# Patient Record
Sex: Female | Born: 1990 | Race: White | Hispanic: No | Marital: Single | Smoking: Never smoker
Health system: Southern US, Community
[De-identification: ages and names within clinical notes are randomized; demographics above are authoritative.]

## PROBLEM LIST (undated history)

## (undated) DIAGNOSIS — F419 Anxiety disorder, unspecified: Secondary | ICD-10-CM

## (undated) DIAGNOSIS — J302 Other seasonal allergic rhinitis: Secondary | ICD-10-CM

## (undated) DIAGNOSIS — J45909 Unspecified asthma, uncomplicated: Secondary | ICD-10-CM

## (undated) HISTORY — DX: Unspecified asthma, uncomplicated: J45.909

---
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
## 2012-07-25 IMAGING — CR DG THORACIC SPINE 3V
3 series · 3 of 3 positions shown · non-contrast
Comparison: None

CLINICAL DATA: Back pain.

THORACIC SPINE - 2 VIEW + SWIMMERS

[w t-spine a.p. *]
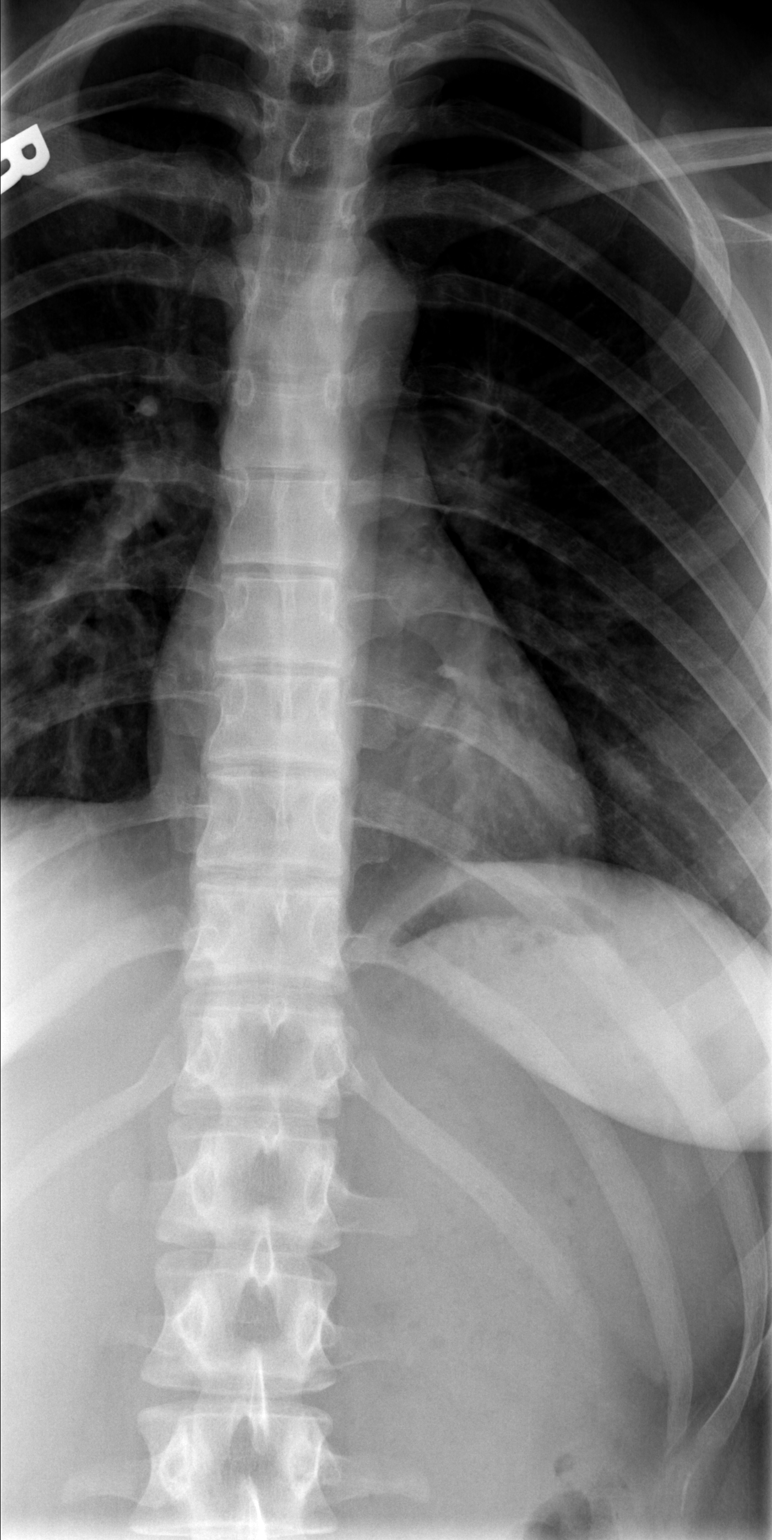

[w t-spine lat *]
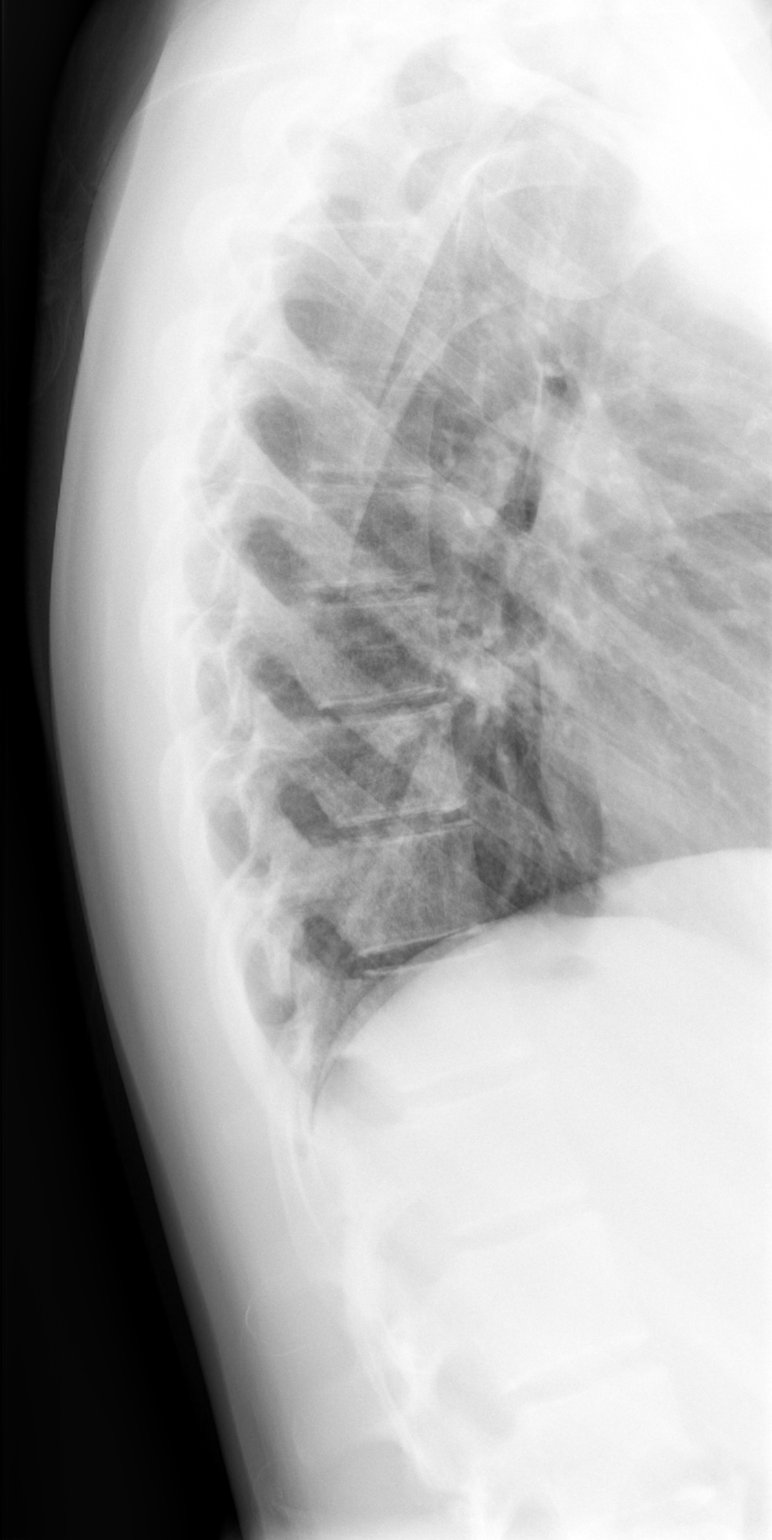

[w swimmers view]
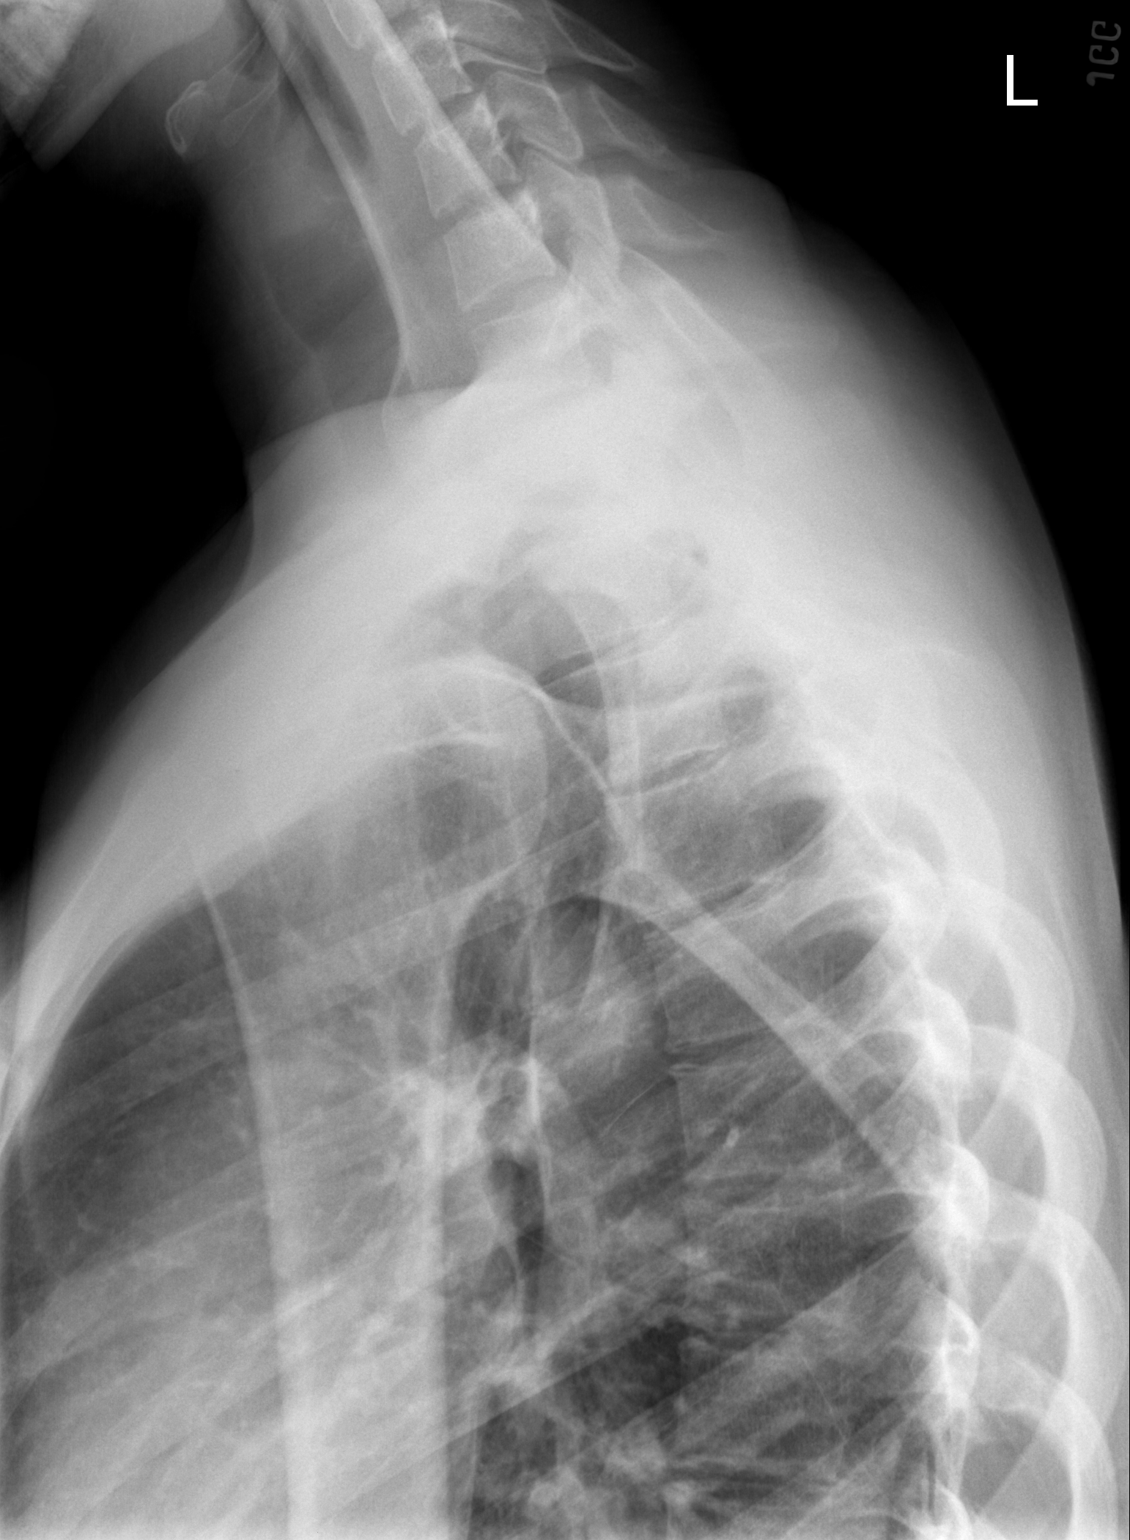

[3 of 3 positions shown; findings below may reference images not displayed]

FINDINGS: The lateral film demonstrates normal alignment of the
thoracic vertebral bodies.  Disc spaces and vertebral bodies are
maintained.  No acute bony findings, destructive bony changes or
abnormal paraspinal soft tissue swelling.  The visualized posterior
ribs appear normal.
IMPRESSION: Normal alignment and no acute bony findings.

## 2013-04-05 ENCOUNTER — Emergency Department: Payer: Self-pay | Admitting: Emergency Medicine

## 2013-04-08 LAB — BETA STREP CULTURE(ARMC)

## 2015-01-29 DIAGNOSIS — J309 Allergic rhinitis, unspecified: Secondary | ICD-10-CM | POA: Insufficient documentation

## 2015-03-26 DIAGNOSIS — F419 Anxiety disorder, unspecified: Secondary | ICD-10-CM

## 2015-03-26 DIAGNOSIS — F329 Major depressive disorder, single episode, unspecified: Secondary | ICD-10-CM | POA: Insufficient documentation

## 2015-04-23 DIAGNOSIS — N644 Mastodynia: Secondary | ICD-10-CM | POA: Insufficient documentation

## 2015-04-26 ENCOUNTER — Other Ambulatory Visit: Payer: Self-pay | Admitting: Family Medicine

## 2015-04-26 DIAGNOSIS — N6320 Unspecified lump in the left breast, unspecified quadrant: Secondary | ICD-10-CM

## 2015-04-30 ENCOUNTER — Ambulatory Visit
Admission: RE | Admit: 2015-04-30 | Discharge: 2015-04-30 | Disposition: A | Discharge status: Home / Self Care | Payer: Commercial Managed Care - PPO | Source: Ambulatory Visit | Attending: Family Medicine | Admitting: Family Medicine

## 2015-04-30 ENCOUNTER — Other Ambulatory Visit: Payer: Self-pay

## 2015-04-30 DIAGNOSIS — N6320 Unspecified lump in the left breast, unspecified quadrant: Secondary | ICD-10-CM

## 2015-06-26 ENCOUNTER — Emergency Department (HOSPITAL_BASED_OUTPATIENT_CLINIC_OR_DEPARTMENT_OTHER)
Admission: EM | Admit: 2015-06-26 | Discharge: 2015-06-26 | Disposition: A | Discharge status: Home / Self Care | Payer: Commercial Managed Care - PPO | Attending: Emergency Medicine | Admitting: Emergency Medicine

## 2015-06-26 ENCOUNTER — Encounter (HOSPITAL_BASED_OUTPATIENT_CLINIC_OR_DEPARTMENT_OTHER): Payer: Self-pay | Admitting: Emergency Medicine

## 2015-06-26 DIAGNOSIS — E869 Volume depletion, unspecified: Secondary | ICD-10-CM | POA: Insufficient documentation

## 2015-06-26 DIAGNOSIS — Z3202 Encounter for pregnancy test, result negative: Secondary | ICD-10-CM | POA: Insufficient documentation

## 2015-06-26 DIAGNOSIS — Z79899 Other long term (current) drug therapy: Secondary | ICD-10-CM | POA: Diagnosis not present

## 2015-06-26 DIAGNOSIS — Z7951 Long term (current) use of inhaled steroids: Secondary | ICD-10-CM | POA: Diagnosis not present

## 2015-06-26 DIAGNOSIS — R55 Syncope and collapse: Secondary | ICD-10-CM | POA: Diagnosis present

## 2015-06-26 DIAGNOSIS — R110 Nausea: Secondary | ICD-10-CM | POA: Insufficient documentation

## 2015-06-26 DIAGNOSIS — F419 Anxiety disorder, unspecified: Secondary | ICD-10-CM | POA: Diagnosis not present

## 2015-06-26 HISTORY — DX: Anxiety disorder, unspecified: F41.9

## 2015-06-26 HISTORY — DX: Other seasonal allergic rhinitis: J30.2

## 2015-06-26 LAB — CBC
HCT: 39.7 % (ref 36.0–46.0)
HEMOGLOBIN: 13.7 g/dL (ref 12.0–15.0)
MCH: 29.8 pg (ref 26.0–34.0)
MCHC: 34.5 g/dL (ref 30.0–36.0)
MCV: 86.3 fL (ref 78.0–100.0)
PLATELETS: 295 10*3/uL (ref 150–400)
RBC: 4.6 MIL/uL (ref 3.87–5.11)
RDW: 12.4 % (ref 11.5–15.5)
WBC: 12.4 10*3/uL — AB (ref 4.0–10.5)

## 2015-06-26 LAB — URINE MICROSCOPIC-ADD ON: WBC, UA: NONE SEEN WBC/hpf (ref 0–5)

## 2015-06-26 LAB — PREGNANCY, URINE: PREG TEST UR: NEGATIVE

## 2015-06-26 LAB — BASIC METABOLIC PANEL
ANION GAP: 6 (ref 5–15)
BUN: 8 mg/dL (ref 6–20)
CHLORIDE: 111 mmol/L (ref 101–111)
CO2: 24 mmol/L (ref 22–32)
Calcium: 8.5 mg/dL — ABNORMAL LOW (ref 8.9–10.3)
Creatinine, Ser: 0.53 mg/dL (ref 0.44–1.00)
GFR calc Af Amer: >60 mL/min (ref >60)
GLUCOSE: 93 mg/dL (ref 65–99)
POTASSIUM: 4.4 mmol/L (ref 3.5–5.1)
SODIUM: 141 mmol/L (ref 135–145)

## 2015-06-26 LAB — URINALYSIS, ROUTINE W REFLEX MICROSCOPIC
BILIRUBIN URINE: NEGATIVE
Glucose, UA: NEGATIVE mg/dL
KETONES UR: NEGATIVE mg/dL
Leukocytes, UA: NEGATIVE
NITRITE: NEGATIVE
Protein, ur: NEGATIVE mg/dL
SPECIFIC GRAVITY, URINE: 1.015 (ref 1.005–1.030)
pH: 8.5 — ABNORMAL HIGH (ref 5.0–8.0)

## 2015-06-26 MED ORDER — SODIUM CHLORIDE 0.9 % IV SOLN
1000.0000 mL | Freq: Once | INTRAVENOUS | Status: AC
  Administered 2015-06-26: 1000 mL via INTRAVENOUS

## 2015-06-26 MED ORDER — ONDANSETRON HCL 4 MG/2ML IJ SOLN
4.0000 mg | Freq: Once | INTRAMUSCULAR | Status: AC
  Administered 2015-06-26: 4 mg via INTRAVENOUS
  Filled 2015-06-26: qty 2

## 2015-06-26 MED ORDER — SODIUM CHLORIDE 0.9 % IV SOLN
1000.0000 mL | INTRAVENOUS | Status: DC
  Administered 2015-06-26: 1000 mL via INTRAVENOUS

## 2015-06-26 MED ORDER — ONDANSETRON 8 MG PO TBDP: 8.0000 mg | ORAL_TABLET | Freq: Three times a day (TID) | ORAL | Status: DC | PRN

## 2015-06-26 MED FILL — ONDANSETRON ODT 8 MG TABLET: 8 | 3 days supply | Qty: 9 | Fill #0

## 2015-06-26 NOTE — ED Provider Notes (Signed)
CSN: [id number]     Arrival date & time 06/26/15  7829 History   First MD Initiated Contact with Patient 06/26/15 [phone]     Chief Complaint  Patient presents with  . Near Syncope     HPI Patient reports that today she suddenly became nauseated and felt weak and lightheaded.  She felt as though she is going to pass out.  No preceding chest pain or palpitations.  She has not had any vomiting.  She states she felt fine yesterday went to bed feeling normal.  Today she is felt poorly.  She denies dysuria or urinary frequency.  She has no major medical illnesses.  She reports normal menstrual cycle last week with no abnormal vaginal bleeding.  She has been told that she was anemic before.  She is taking iron supplements per the patient.  No other complaints.   Past Medical History  Diagnosis Date  . Anxiety   . Seasonal allergies    No past surgical history on file. No family history on file. Social History  Substance Use Topics  . Smoking status: Never Smoker   . Smokeless tobacco: None  . Alcohol Use: None   OB History    No data available     Review of Systems  All other systems reviewed and are negative.     Allergies  Review of patient's allergies indicates no known allergies.  Home Medications   Prior to Admission medications   Medication Sig Start Date End Date Taking? Authorizing Provider  cetirizine (ZYRTEC) 5 MG tablet Take 5 mg by mouth daily.   Yes Historical Provider, MD  FLUoxetine (PROZAC) 40 MG capsule Take 40 mg by mouth daily.   Yes Historical Provider, MD  fluticasone (FLONASE) 50 MCG/ACT nasal spray Place 2 sprays into both nostrils daily.   Yes Historical Provider, MD  montelukast (SINGULAIR) 10 MG tablet Take 10 mg by mouth at bedtime.   Yes Historical Provider, MD  UNKNOWN TO PATIENT    Yes Historical Provider, MD   BP 119/74 mmHg  Pulse 72  Temp(Src) 98.4 F (36.9 C) (Oral)  Resp 22  SpO2 100%  LMP 06/18/2015 Physical Exam  Constitutional: She  is oriented to person, place, and time. She appears well-developed and well-nourished. No distress.  HENT:  Head: Normocephalic and atraumatic.  Eyes: EOM are normal.  Neck: Normal range of motion.  Cardiovascular: Normal rate, regular rhythm and normal heart sounds.   Pulmonary/Chest: Effort normal and breath sounds normal.  Abdominal: Soft. She exhibits no distension. There is no tenderness.  Musculoskeletal: Normal range of motion.  Neurological: She is alert and oriented to person, place, and time.  Skin: Skin is warm and dry. There is pallor.  Psychiatric: She has a normal mood and affect. Judgment normal.  Nursing note and vitals reviewed.   ED Course  Procedures (including critical care time) Labs Review Labs Reviewed  CBC - Abnormal; Notable for the following:    WBC 12.4 (*)    All other components within normal limits  URINALYSIS, ROUTINE W REFLEX MICROSCOPIC (NOT AT [facility]) - Abnormal; Notable for the following:    pH 8.5 (*)    Hgb urine dipstick TRACE (*)    All other components within normal limits  URINE MICROSCOPIC-ADD ON - Abnormal; Notable for the following:    Squamous Epithelial / LPF 0-5 (*)    Bacteria, UA RARE (*)    All other components within normal limits  BASIC METABOLIC PANEL - Abnormal;  Notable for the following:    Calcium 8.5 (*)    All other components within normal limits  PREGNANCY, URINE  BASIC METABOLIC PANEL    Imaging Review No results found. I have personally reviewed and evaluated these images and lab results as part of my medical decision-making.   EKG Interpretation   Date/Time:  Tuesday June 26 2015 09:28:51 EDT Ventricular Rate:  80 PR Interval:  131 QRS Duration: 120 QT Interval:  400 QTC Calculation: 461 R Axis:   77 Text Interpretation:  Sinus rhythm IVCD, consider atypical RBBB No old  tracing to compare Confirmed by Deeanne Deininger  MD, Morris Markham ([id number]) on 06/26/2015  10:53:45 AM      MDM   Final diagnoses:  Nausea   Volume depletion  Near syncope    1:21 PM Patient feels much better this time.  She can ambulate without difficulty around the ER.  She feels better after IV fluids.  Likely nausea and mild volume depletion.  Repeat abdominal exam is benign.  Labs without significant abnormality.  She understands to follow-up with her primary care physician or return to the ER for new or worsening symptoms    Azalia BilisKevin Nitesh Pitstick, MD 06/26/15 1321

## 2015-06-26 NOTE — ED Notes (Signed)
Ambulated in hallway without difficulty independently

## 2015-06-26 NOTE — ED Notes (Signed)
Pt had near syncopal event this am.  Pt was standing at the sink and became dizzy, room was spinning, vision started to close, turning black.   Pt sat down immediately and then laid down in the bed but dizziness has not improved.  No recent illness.

## 2015-06-26 NOTE — ED Notes (Signed)
Green top hemolyzed according to ChandlerSherry in the lab.  Reordered CMP

## 2015-07-17 DIAGNOSIS — R42 Dizziness and giddiness: Secondary | ICD-10-CM | POA: Insufficient documentation

## 2015-07-17 DIAGNOSIS — L304 Erythema intertrigo: Secondary | ICD-10-CM | POA: Insufficient documentation

## 2015-09-24 DIAGNOSIS — K219 Gastro-esophageal reflux disease without esophagitis: Secondary | ICD-10-CM | POA: Insufficient documentation

## 2015-10-22 ENCOUNTER — Encounter: Payer: Self-pay | Admitting: Pediatrics

## 2015-10-22 ENCOUNTER — Ambulatory Visit (INDEPENDENT_AMBULATORY_CARE_PROVIDER_SITE_OTHER): Payer: Commercial Managed Care - PPO | Admitting: Pediatrics

## 2015-10-22 VITALS — BP 104/60 | HR 76 | Temp 98.8°F | Resp 16 | Ht 66.5 in | Wt 184.6 lb

## 2015-10-22 DIAGNOSIS — T7800XA Anaphylactic reaction due to unspecified food, initial encounter: Secondary | ICD-10-CM

## 2015-10-22 DIAGNOSIS — J4530 Mild persistent asthma, uncomplicated: Secondary | ICD-10-CM | POA: Diagnosis not present

## 2015-10-22 DIAGNOSIS — J301 Allergic rhinitis due to pollen: Secondary | ICD-10-CM

## 2015-10-22 MED ORDER — EPINEPHRINE 0.3 MG/0.3ML IJ SOAJ: 0.3000 mg | Freq: Once | INTRAMUSCULAR | Status: AC

## 2015-10-22 MED ORDER — ALBUTEROL SULFATE HFA 108 (90 BASE) MCG/ACT IN AERS: 2.0000 | INHALATION_SPRAY | RESPIRATORY_TRACT | Status: AC | PRN

## 2015-10-22 NOTE — Progress Notes (Signed)
[address] [place] [address] Dept: [phone]  New Patient Note  Patient ID: Hannah Mcmahon, female    DOB: 10-Oct-1990  Age: 25 y.o. MRN: [id number] Date of Office Visit: 10/22/2015 Referring provider: Macy Mis, MD [address] [place], [place] [address]    Chief Complaint: Allergy Testing and Asthma  HPI Rabecka Brendel presents for evaluation of a runny nose and sneezing and stuffiness for about 10 years. Her symptoms are worse in the springtime. She has had some swelling of her hands and itching since 25 years of age. She has had throat swelling from foods containing citric acid. She has nasal congestion on exposure to dust, cigarette smoke and weather changes. She has a history of asthma since 25 years of age and her asthma is much improved. She now has some shortness of breath with exercise. She has gastroesophageal reflux  Review of Systems  Constitutional: Negative.   HENT:       Nasal congestion for several years which is worse in the springtime  Eyes:       Occasionally itch  Respiratory:       Asthma since 25 years of age but improved  Cardiovascular: Negative.   Gastrointestinal:       Gastroesophageal reflux  Genitourinary: Negative.   Musculoskeletal: Negative.   Skin:       Swelling of her hands and itching of her hands since 25 years of age. No history of eczema  Neurological: Negative.   Endo/Heme/Allergies:       No diabetes or thyroid disease. Throat swelling from citrus-containing foods or drinks  Psychiatric/Behavioral: Negative.     Outpatient Encounter Prescriptions as of 10/22/2015  Medication Sig  . cetirizine (ZYRTEC) 5 MG tablet Take 5 mg by mouth daily.  . clonazePAM (KLONOPIN) 0.5 MG tablet Take 0.5 mg by mouth as needed for anxiety.  Marland Kitchen FLUoxetine (PROZAC) 40 MG capsule Take 40 mg by mouth daily.  . fluticasone (FLONASE) 50 MCG/ACT nasal spray Place 2 sprays into both nostrils daily.  . montelukast (SINGULAIR) 10 MG  tablet Take 10 mg by mouth at bedtime.  . Multiple Vitamins-Minerals (MULTIVITAMIN WOMEN PO) Take 1 tablet by mouth daily.  . norgestrel-ethinyl estradiol (LO/OVRAL,CRYSELLE) 0.3-30 MG-MCG tablet Take 1 tablet by mouth daily.  Marland Kitchen omeprazole (PRILOSEC) 20 MG capsule Take 20 mg by mouth daily.  Marland Kitchen albuterol (VENTOLIN HFA) 108 (90 Base) MCG/ACT inhaler Inhale 2 puffs into the lungs every 4 (four) hours as needed for wheezing or shortness of breath.  . EPINEPHrine 0.3 mg/0.3 mL IJ SOAJ injection Inject 0.3 mLs (0.3 mg total) into the muscle once.  . [DISCONTINUED] ondansetron (ZOFRAN ODT) 8 MG disintegrating tablet Take 1 tablet (8 mg total) by mouth every 8 (eight) hours as needed for nausea or vomiting.  . [DISCONTINUED] UNKNOWN TO PATIENT    No facility-administered encounter medications on file as of 10/22/2015.     Drug Allergies:  No Known Allergies  Family History: Aarini's family history includes Allergic rhinitis in her mother..Asthma in her maternal grandmother, sinus problems. There is no family history of eczema hives, food allergies, chronic bronchitis or emphysema.  Social and environmental. There two cats in the home. She has never smoked cigarettes. She is not exposed to cigarette smoking. 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  Physical Exam: BP 104/60 mmHg  Pulse 76  Temp(Src) 98.8 F (37.1 C) (Oral)  Resp 16  Ht 5' 6.5" (1.689 m)  Wt 184 lb 9.6  oz (83.734 kg)  BMI 29.35 kg/m2   Physical Exam  Constitutional: She is oriented to person, place, and time. She appears well-developed and well-nourished.  HENT:  Eyes normal. Ears normal. Nose moderate swelling of nasal turbinates with clear nasal discharge. Pharynx normal.  Neck: Neck supple. No thyromegaly present.  Cardiovascular:  S1 and S2 normal no murmurs  Pulmonary/Chest:  Clear to percussion auscultation  Abdominal: Soft. There is no tenderness (no hepatosplenomegaly).  Lymphadenopathy:    She has no cervical  adenopathy.  Neurological: She is alert and oriented to person, place, and time.  Skin:  Clear  Psychiatric: She has a normal mood and affect. Her behavior is normal. Judgment and thought content normal.  Vitals reviewed.   Diagnostics: FVC 4.39 L FEV1 3.83 L. Predicted FVC 4.27 L predicted FEV1 3.42 L. After albuterol 2 puffs FVC 4.45 L FEV1 4.07 L-the spirometry is in the normal range and there was no significant improvement after albuterol  Allergy skin tests were positive to tree pollens, a common weed, cat, dog, cashew and hazelnut   Assessment Assessment and Plan: 1. Mild persistent asthma, uncomplicated   2. Allergic rhinitis due to pollen   3. Allergy with anaphylaxis due to food, initial encounter     Meds ordered this encounter  Medications  . EPINEPHrine 0.3 mg/0.3 mL IJ SOAJ injection    Sig: Inject 0.3 mLs (0.3 mg total) into the muscle once.    Dispense:  2 Device    Refill:  1    mylan generic only.  Marland Kitchen. albuterol (VENTOLIN HFA) 108 (90 Base) MCG/ACT inhaler    Sig: Inhale 2 puffs into the lungs every 4 (four) hours as needed for wheezing or shortness of breath.    Dispense:  1 Inhaler    Refill:  3    Patient Instructions  Environmental control of dust Keep the cats out of her bedroom Cetirizine 10 mg at night for runny nose or itchy eyes Opcon-A one drop 3 times a day if needed for itchy eyes Fluticasone 2 sprays per nostril at night for stuffy nose Montelukast 10 mg once a day for coughing or wheezing Ventolin 2 puffs every 4 hours if needed for wheezing or coughing spells Add prednisone 10 mg twice a day for 4 days, 10 mg on the fifth day to bring your allergic symptoms under control  Avoid tree nuts and foods with a lot of citric acid. If you have an allergic reaction take Benadryl 50 mg every 4 hours and if you have life-threatening symptoms inject with EpiPen 0.3 mg    Return in about 4 weeks (around 11/19/2015).   Thank you for the opportunity to  care for this patient.  Please do not hesitate to contact me with questions.  Tonette BihariJ. A. Bardelas, M.D.  [facility] [address] [place], [place] [address] [phone]

## 2015-10-22 NOTE — Patient Instructions (Addendum)
Environmental control of dust Keep the cats out of her bedroom Cetirizine 10 mg at night for runny nose or itchy eyes Opcon-A one drop 3 times a day if needed for itchy eyes Fluticasone 2 sprays per nostril at night for stuffy nose Montelukast 10 mg once a day for coughing or wheezing Ventolin 2 puffs every 4 hours if needed for wheezing or coughing spells Add prednisone 10 mg twice a day for 4 days, 10 mg on the fifth day to bring your allergic symptoms under control  Avoid tree nuts and foods with a lot of citric acid. If you have an allergic reaction take Benadryl 50 mg every 4 hours and if you have life-threatening symptoms inject with EpiPen 0.3 mg

## 2015-11-19 ENCOUNTER — Encounter: Payer: Self-pay | Admitting: Pediatrics

## 2015-11-19 ENCOUNTER — Ambulatory Visit (INDEPENDENT_AMBULATORY_CARE_PROVIDER_SITE_OTHER): Payer: Commercial Managed Care - PPO | Admitting: Pediatrics

## 2015-11-19 VITALS — BP 114/78 | HR 74 | Temp 98.9°F | Resp 16 | Ht 66.3 in | Wt 189.0 lb

## 2015-11-19 DIAGNOSIS — J301 Allergic rhinitis due to pollen: Secondary | ICD-10-CM

## 2015-11-19 DIAGNOSIS — J4530 Mild persistent asthma, uncomplicated: Secondary | ICD-10-CM

## 2015-11-19 DIAGNOSIS — K219 Gastro-esophageal reflux disease without esophagitis: Secondary | ICD-10-CM

## 2015-11-19 DIAGNOSIS — T7800XD Anaphylactic reaction due to unspecified food, subsequent encounter: Secondary | ICD-10-CM | POA: Diagnosis not present

## 2015-11-19 LAB — PULMONARY FUNCTION TEST

## 2015-11-19 MED ORDER — "TUBERCULIN SYRINGE 27G X 1/2"" 1 ML MISC": 3 refills | Status: DC

## 2015-11-19 NOTE — Patient Instructions (Addendum)
Continue on her present medications Follow-up in 2 weeks to start her on allergy injections  Avoid tree nuts and foods with a lot of citric acid. If you have an allergic reaction take Benadryl 50 mg every 4 hours and if you have life-threatening symptoms inject  with EpiPen 0.3 mg Call me if you are not doing well on this treatment plan

## 2015-11-19 NOTE — Addendum Note (Signed)
Addended by: Stephannie LiBARDELAS, JOSE A on: 11/19/2015 08:47 PM   Modules accepted: Orders

## 2015-11-19 NOTE — Progress Notes (Signed)
  [address] [place] [address] Dept: [phone]  FOLLOW UP NOTE  Patient ID: Hannah HouseKrista Mcmahon, female    DOB: 1990-08-01  Age: 25 y.o. MRN: [id number] Date of Office Visit: 11/19/2015  Assessment  Chief Complaint: Immunotherapy (discuss starting itx)  HPI Hannah HouseKrista Mcmahon presents for follow-up of asthma, allergic rhinitis and food allergies. She would like to go ahead and be started on allergy injections. She is allergic to cashew, hazelnut and foods with a lot of citric acid. She avoids tree nuts  Current medications are montelukast  10 mg once a day, Ventolin 2 puffs every 4 hours if needed, cetirizine 10 mg once a day, Benadryl and EpiPen 0.3 mg if needed and omeprazole 20 mg daily. Her other medications are outlined in the chart.   Drug Allergies:  No Known Allergies  Physical Exam: BP 114/78 (BP Location: Right Arm, Patient Position: Sitting, Cuff Size: Large)   Pulse 74   Temp 98.9 F (37.2 C) (Oral)   Resp 16   Ht 5' 6.3" (1.684 m)   Wt 189 lb (85.7 kg)   SpO2 99%   BMI 30.23 kg/m    Physical Exam  Constitutional: She is oriented to person, place, and time. She appears well-developed and well-nourished.  HENT:  Eyes normal. Ears normal. Nose mild swelling of his turbinates. Pharynx normal.  Neck: Neck supple.  Cardiovascular:  S1 and S2 normal no murmurs  Pulmonary/Chest:  Clear to percussion auscultation  Lymphadenopathy:    She has no cervical adenopathy.  Neurological: She is alert and oriented to person, place, and time.  Skin:  Clear  Psychiatric: She has a normal mood and affect. Her behavior is normal. Judgment and thought content normal.  Vitals reviewed.   Diagnostics:  FVC 4.46 L FEV1 3.83 L. Predicted FVC 4.05 L predicted FEV1 3.48 L-the spirometry is in the normal range  Assessment and Plan: 1. Mild persistent asthma, uncomplicated   2. Allergic rhinitis due to pollen   3. Allergy with anaphylaxis due to food, subsequent encounter   4.  Gastroesophageal reflux disease without esophagitis     Meds ordered this encounter  Medications  . TUBERCULIN SYR 1CC/27GX1/2" (B-D TB SYRINGE 1CC/27GX1/2") 27G X 1/2" 1 ML MISC    Sig: Use as directed with allergy injections.    Dispense:  25 each    Refill:  3    Patient Instructions  Continue on her present medications Follow-up in 2 weeks to start her on allergy injections  Avoid tree nuts and foods with a lot of citric acid. If you have an allergic reaction take Benadryl 50 mg every 4 hours and if you have life-threatening symptoms inject  with EpiPen 0.3 mg Call me if you are not doing well on this treatment plan   Return in about 6 months (around 05/21/2016).    Thank you for the opportunity to care for this patient.  Please do not hesitate to contact me with questions.  Tonette BihariJ. A. Presley Summerlin, M.D.  [facility] [address] [place], [place] [address] [phone]

## 2015-11-20 DIAGNOSIS — J301 Allergic rhinitis due to pollen: Secondary | ICD-10-CM | POA: Diagnosis not present

## 2015-11-21 DIAGNOSIS — J3081 Allergic rhinitis due to animal (cat) (dog) hair and dander: Secondary | ICD-10-CM | POA: Diagnosis not present

## 2015-12-03 ENCOUNTER — Ambulatory Visit (INDEPENDENT_AMBULATORY_CARE_PROVIDER_SITE_OTHER): Payer: Commercial Managed Care - PPO

## 2015-12-03 DIAGNOSIS — J309 Allergic rhinitis, unspecified: Secondary | ICD-10-CM | POA: Diagnosis not present

## 2015-12-03 NOTE — Progress Notes (Signed)
Immunotherapy   Patient Details  Name: Hannah Mcmahon Lewinski MRN: [id number] Date of Birth: 17-Aug-1990  12/03/2015  Hannah Mcmahon Taft started injections for Blue100,000 (Tree and Weed-Cat-Dog) @ 0.05 given Following schedule: A  Frequency:1 time per week. Patient's mom is an Charity fundraiserN and will be giving her injections at home. OK by Dr. Beaulah DinningBardelas (per mom) Epi-Pen:Epi-Pen Available  Consent signed and patient instructions given.   Darianna Amy 12/03/2015, 3:07 PM

## 2016-02-11 ENCOUNTER — Other Ambulatory Visit (HOSPITAL_BASED_OUTPATIENT_CLINIC_OR_DEPARTMENT_OTHER): Payer: Self-pay

## 2016-02-11 DIAGNOSIS — F515 Nightmare disorder: Secondary | ICD-10-CM

## 2016-02-11 DIAGNOSIS — F513 Sleepwalking [somnambulism]: Secondary | ICD-10-CM

## 2016-02-11 DIAGNOSIS — G478 Other sleep disorders: Secondary | ICD-10-CM

## 2016-02-11 DIAGNOSIS — R5383 Other fatigue: Secondary | ICD-10-CM

## 2016-02-19 ENCOUNTER — Ambulatory Visit: Payer: Commercial Managed Care - PPO

## 2016-02-26 ENCOUNTER — Ambulatory Visit: Payer: Commercial Managed Care - PPO

## 2016-03-04 ENCOUNTER — Ambulatory Visit: Payer: Commercial Managed Care - PPO

## 2016-03-11 ENCOUNTER — Ambulatory Visit (INDEPENDENT_AMBULATORY_CARE_PROVIDER_SITE_OTHER): Payer: Commercial Managed Care - PPO

## 2016-03-11 DIAGNOSIS — J309 Allergic rhinitis, unspecified: Secondary | ICD-10-CM | POA: Diagnosis not present

## 2016-03-11 NOTE — Progress Notes (Signed)
Immunotherapy   Patient Details  Name: Hannah Mcmahon MRN: [id number] Date of Birth: 1991/04/01  03/11/2016  Hannah Mcmahon  Taking vials out to pt.'s home mom is a RN Gold 1:10,000 weed-cat-dog 0.05 (L) arm and Tree 0.05 (R) Following schedule: A  Frequency:1 time per week Epi-Pen:Epi-Pen Available  Consent signed and patient instructions given.   Murray HodgkinsMichelle Khaleah Duer 03/11/2016, 2:36 PM

## 2016-03-17 ENCOUNTER — Ambulatory Visit (HOSPITAL_BASED_OUTPATIENT_CLINIC_OR_DEPARTMENT_OTHER): Payer: Commercial Managed Care - PPO | Attending: Family Medicine | Admitting: Internal Medicine

## 2016-03-17 VITALS — Ht 66.0 in | Wt 195.0 lb

## 2016-03-17 DIAGNOSIS — R0683 Snoring: Secondary | ICD-10-CM | POA: Insufficient documentation

## 2016-03-17 DIAGNOSIS — G478 Other sleep disorders: Secondary | ICD-10-CM

## 2016-03-17 DIAGNOSIS — F513 Sleepwalking [somnambulism]: Secondary | ICD-10-CM | POA: Insufficient documentation

## 2016-03-17 DIAGNOSIS — R5383 Other fatigue: Secondary | ICD-10-CM

## 2016-03-17 DIAGNOSIS — R400 Somnolence: Secondary | ICD-10-CM | POA: Diagnosis present

## 2016-03-17 DIAGNOSIS — F515 Nightmare disorder: Secondary | ICD-10-CM

## 2016-03-18 ENCOUNTER — Ambulatory Visit: Payer: Commercial Managed Care - PPO

## 2016-03-22 DIAGNOSIS — R5383 Other fatigue: Secondary | ICD-10-CM

## 2016-03-22 NOTE — Procedures (Signed)
  Patient Name: Hannah Mcmahon, Moniqua Study Date: 03/17/2016 Gender: Female D.O.B: 1991-03-09 Age (years): 25 Referring Provider: Nadyne CoombesKaren Richter Height (inches): 66 Interpreting Physician: Jetty Duhamellinton Hevin Jeffcoat MD, ABSM Weight (lbs): 195 RPSGT: Wylie HailDavis, Rico BMI: 31 MRN: [id number] Neck Size: 16.00 CLINICAL INFORMATION Sleep Study Type: NPSG     Indication for sleep study: Fatigue     Epworth Sleepiness Score: 10/24     SLEEP STUDY TECHNIQUE As per the AASM Manual for the Scoring of Sleep and Associated Events v2.3 (April 2016) with a hypopnea requiring 4% desaturations.  The channels recorded and monitored were frontal, central and occipital EEG, electrooculogram (EOG), submentalis EMG (chin), nasal and oral airflow, thoracic and abdominal wall motion, anterior tibialis EMG, snore microphone, electrocardiogram, and pulse oximetry.  MEDICATIONS Medications self-administered by patient taken the night of the study : none reported  SLEEP ARCHITECTURE The study was initiated at 10:04:04 PM and ended at 4:26:20 AM.  Sleep onset time was 49.7 minutes and the sleep efficiency was 76.5%. The total sleep time was 292.6 minutes.  Stage REM latency was 134.0 minutes.  The patient spent 22.22% of the night in stage N1 sleep, 45.31% in stage N2 sleep, 21.70% in stage N3 and 10.77% in REM.  Alpha intrusion was absent.  Supine sleep was 57.10%.  RESPIRATORY PARAMETERS The overall apnea/hypopnea index (AHI) was 0.6 per hour. There were 0 total apneas, including 0 obstructive, 0 central and 0 mixed apneas. There were 3 hypopneas and 17 RERAs.  The AHI during Stage REM sleep was 1.9 per hour.  AHI while supine was 0.7 per hour.  The mean oxygen saturation was 95.49%. The minimum SpO2 during sleep was 92.00%.  Soft snoring was noted during this study.  CARDIAC DATA The 2 lead EKG demonstrated sinus rhythm. The mean heart rate was 69.71 beats per minute. Other EKG findings include:  None.  LEG MOVEMENT DATA The total PLMS were 60 with a resulting PLMS index of 12.30. Associated arousal with leg movement index was 3.5 .  IMPRESSIONS - No significant obstructive sleep apnea occurred during this study (AHI = 0.6/h). - No significant central sleep apnea occurred during this study (CAI = 0.0/h). - The patient had minimal or no oxygen desaturation during the study (Min O2 = 92.00%) - The patient snored with Soft snoring volume. - No cardiac abnormalities were noted during this study. - Mild periodic limb movements of sleep occurred during the study. No significant associated arousals.  DIAGNOSIS - Normal study  RECOMMENDATIONS - Avoid alcohol, sedatives and other CNS depressants that may worsen sleep apnea and disrupt normal sleep architecture. - Sleep hygiene should be reviewed to assess factors that may improve sleep quality. - Weight management and regular exercise should be initiated or continued if appropriate.  [Electronically signed] 03/22/2016 12:11 PM  Jetty Duhamellinton Shannan Slinker MD, ABSM Diplomate, American Board of Sleep Medicine   NPI: [id number]  Waymon BudgeYOUNG,Jerilyn Gillaspie D Diplomate, American Board of Sleep Medicine  ELECTRONICALLY SIGNED ON:  03/22/2016, 12:11 PM [facility] PH: [phone]   FX: [phone] ACCREDITED BY THE AMERICAN ACADEMY OF SLEEP MEDICINE

## 2016-05-26 ENCOUNTER — Ambulatory Visit: Payer: Commercial Managed Care - PPO | Admitting: Pediatrics

## 2016-05-27 ENCOUNTER — Ambulatory Visit (INDEPENDENT_AMBULATORY_CARE_PROVIDER_SITE_OTHER): Payer: Commercial Managed Care - PPO | Admitting: Pediatrics

## 2016-05-27 ENCOUNTER — Encounter: Payer: Self-pay | Admitting: Pediatrics

## 2016-05-27 VITALS — BP 116/66 | HR 100 | Temp 98.4°F | Resp 16

## 2016-05-27 DIAGNOSIS — J301 Allergic rhinitis due to pollen: Secondary | ICD-10-CM

## 2016-05-27 DIAGNOSIS — T7800XD Anaphylactic reaction due to unspecified food, subsequent encounter: Secondary | ICD-10-CM | POA: Diagnosis not present

## 2016-05-27 DIAGNOSIS — J4530 Mild persistent asthma, uncomplicated: Secondary | ICD-10-CM | POA: Diagnosis not present

## 2016-05-27 NOTE — Progress Notes (Signed)
  [address] [place] [address] Dept: [phone]  FOLLOW UP NOTE  Patient ID: Hannah Mcmahon, female    DOB: [birth year]  Age: 26 y.o. MRN: [id number] Date of Office Visit: 05/27/2016  Assessment  Chief Complaint: Asthma  HPI Hannah Mcmahon presents for follow-up of asthma and allergic rhinitis. Her asthma is well controlled. She is on  yellow vials of allergy extract. One vial contains trees and the other one weed's cat and dog. She has large local reactions to that tree pollen vial. She avoids tree nuts. She had a rash off on her face when she used fluticasone.   Current medications are cetirizine 10 mg once a day, montelukast  10 mg once a day, Ventolin 2 puffs every 4 hours if needed and Benadryl and EpiPen 0.3 mg in case of an allergic reaction to a food   Drug Allergies:  Allergies  Allergen Reactions  . Flonase [Fluticasone Propionate] Rash    Broke out in rash on face    Physical Exam: BP 116/66   Pulse 100   Temp 98.4 F (36.9 C) (Oral)   Resp 16   SpO2 99%    Physical Exam  Constitutional: She is oriented to person, place, and time. She appears well-developed and well-nourished.  HENT:  Eyes normal. Ears normal. Nose mild swelling of nasal turbinates. Pharynx normal.  Neck: Neck supple.  Cardiovascular:  S1 and S2 normal no murmurs  Pulmonary/Chest:  Clear to percussion and auscultation  Lymphadenopathy:    She has no cervical adenopathy.  Neurological: She is alert and oriented to person, place, and time.  Psychiatric: She has a normal mood and affect. Her behavior is normal. Judgment and thought content normal.  Vitals reviewed.   Diagnostics:  FVC 4.30 L FEV1 3.79 L. Predicted FVC 4.05 L predicted FEV1 3.48 L-the spirometry is in the normal range  Assessment and Plan: 1. Mild persistent asthma without complication   2. Anaphylactic shock due to food, subsequent encounter   3. Acute seasonal allergic rhinitis due to pollen       Patient  Instructions  Continue on her present treatment plan Instead of  fluticasone she may try Nasacort AQ 1 spray per nostril twice a day Her mother will fax me the allergy injections records. She will need to go through another Gold vial which includes tree  pollen Call me if she's not doing well on this treatment plan   Return in about 6 months (around 11/24/2016).    Thank you for the opportunity to care for this patient.  Please do not hesitate to contact me with questions.  Tonette BihariJ. A. Oluwatobi Ruppe, M.D.  [facility] [address] [place], [place] [address] [phone]

## 2016-05-27 NOTE — Patient Instructions (Addendum)
Continue on her present treatment plan Instead of  fluticasone she may try Nasacort AQ 1 spray per nostril twice a day Her mother will fax me the allergy injections records. She will need to go through another Gold vial which includes tree  pollen Call me if she's not doing well on this treatment plan

## 2016-06-03 ENCOUNTER — Ambulatory Visit (INDEPENDENT_AMBULATORY_CARE_PROVIDER_SITE_OTHER): Payer: Commercial Managed Care - PPO

## 2016-06-03 DIAGNOSIS — J309 Allergic rhinitis, unspecified: Secondary | ICD-10-CM

## 2016-06-03 NOTE — Progress Notes (Signed)
Immunotherapy   Patient Details  Name: Edger HouseKrista Morre MRN: [id number] Date of Birth: May 18, 1990  06/03/2016  Edger HouseKrista Colborn here to pick up Green 1:1000 (Weed-Cat-Dog)@ 0.05 and Gold 1:10,000 (Tree) @ 0 .10 Following schedule: A  Frequency:1 time per week Epi-Pen:Epi-Pen Available  Consent signed and patient instructions given. No problems   Bralin Garry 06/03/2016, 4:00 PM

## 2016-09-04 DIAGNOSIS — J301 Allergic rhinitis due to pollen: Secondary | ICD-10-CM | POA: Diagnosis not present

## 2016-09-04 NOTE — Progress Notes (Signed)
Vials to be made 09-04-16  jm 

## 2016-09-15 ENCOUNTER — Ambulatory Visit: Payer: Commercial Managed Care - PPO

## 2016-09-15 ENCOUNTER — Ambulatory Visit (INDEPENDENT_AMBULATORY_CARE_PROVIDER_SITE_OTHER): Payer: Commercial Managed Care - PPO

## 2016-09-15 DIAGNOSIS — J309 Allergic rhinitis, unspecified: Secondary | ICD-10-CM

## 2016-09-15 NOTE — Progress Notes (Signed)
Immunotherapy   Patient Details  Name: Edger HouseKrista Quiroa MRN: [id number] Date of Birth: 12-22-1990  09/15/2016  Edger HouseKrista Oshields  Weed-cat-dog green 04/998 and Tree 04/998 @ 0.10.  Repeat due to order & no records. Informed pt to bring records next visits. Following schedule: A  Frequency:1 time per week Epi-Pen:Epi-Pen Available  Consent signed and patient instructions given.   Jacqulyn CaneJanet Lional Icenogle 09/15/2016, 1:58 PM

## 2016-10-06 ENCOUNTER — Other Ambulatory Visit: Payer: Self-pay

## 2016-10-06 MED ORDER — "TUBERCULIN SYRINGE 27G X 1/2"" 1 ML MISC": 3 refills | Status: DC

## 2016-10-06 NOTE — Telephone Encounter (Signed)
Sent in refill on syringes to pt.'s pharmacy.

## 2016-10-28 ENCOUNTER — Ambulatory Visit: Payer: Commercial Managed Care - PPO | Admitting: Pediatrics

## 2016-12-15 ENCOUNTER — Encounter: Payer: Self-pay | Admitting: Pediatrics

## 2016-12-15 ENCOUNTER — Ambulatory Visit (INDEPENDENT_AMBULATORY_CARE_PROVIDER_SITE_OTHER): Payer: Commercial Managed Care - PPO | Admitting: Pediatrics

## 2016-12-15 VITALS — BP 100/70 | HR 75 | Temp 98.2°F | Resp 16 | Ht 66.34 in | Wt 194.2 lb

## 2016-12-15 DIAGNOSIS — J4530 Mild persistent asthma, uncomplicated: Secondary | ICD-10-CM

## 2016-12-15 DIAGNOSIS — T7800XD Anaphylactic reaction due to unspecified food, subsequent encounter: Secondary | ICD-10-CM

## 2016-12-15 DIAGNOSIS — J301 Allergic rhinitis due to pollen: Secondary | ICD-10-CM

## 2016-12-15 NOTE — Progress Notes (Signed)
  [address] [place] [address] Dept: [phone]  FOLLOW UP NOTE  Patient ID: Hannah HouseKrista Rougeau, female    DOB: Jun 17, 1990  Age: 26 y.o. MRN: [id number] Date of Office Visit: 12/15/2016  Assessment  Chief Complaint: Asthma  HPI Hannah Mcmahon presents for follow-up of asthma and allergic rhinitis. Her asthma is well controlled. Her nasal symptoms are well controlled. She will be starting her first green vial of tree  pollen next week , and her first red vial of weeds, cat and dog next week.. She has 5 more days of amoxicillin for a sinus infection. She has had fullness in the left ear  Current medications will be outlined in the after visit summary     Drug Allergies:  Allergies  Allergen Reactions  . Citrus Other (See Comments)    Mild throat and hand swelling  . Other Swelling    Allergic to tree nuts-mild throat and hand swelling  . Flonase [Fluticasone Propionate] Rash    Broke out in rash on face    Physical Exam: BP 100/70   Pulse 75   Temp 98.2 F (36.8 C) (Oral)   Resp 16   Ht 5' 6.34" (1.685 m)   Wt 194 lb 3.6 oz (88.1 kg)   SpO2 98%   BMI 31.03 kg/m    Physical Exam  Constitutional: She is oriented to person, place, and time. She appears well-developed and well-nourished.  HENT:  Eyes normal. Ears showed mild retraction of the left eardrum. Nose mild swelling of nasal turbinates. Pharynx normal.  Neck: Neck supple.  Cardiovascular:  S1 and S2 normal no murmurs  Pulmonary/Chest:  Clear to percussion and auscultation  Lymphadenopathy:    She has no cervical adenopathy.  Neurological: She is alert and oriented to person, place, and time.  Psychiatric: She has a normal mood and affect. Her behavior is normal. Judgment and thought content normal.  Vitals reviewed.   Diagnostics:  FVC 4.37 L FEV1 3.72 L predicted FVC 4.05 L predicted 13.47 liters-the spirometry is in the normal range  Assessment and Plan: 1. Mild persistent asthma without  complication   2. Seasonal allergic rhinitis due to pollen   3. Anaphylactic shock due to food, subsequent encounter        Patient Instructions  Cetirizine 10 mg once a day for runny nose Montelukast 10 mg once a day for coughing or wheezing Complete the course of amoxicillin  Ventolin 2 puffs every 4 hours if needed for wheezing or coughing spells Come in next week for her next vials Call me if she is not doing better on this treatment plan Continue on her other medications  Avoid tree nuts. If she has an allergic reaction give Benadryl 50 mg every 4 hours and if she has life-threatening symptoms inject  with EpiPen 0.3 mg      Return in about 1 year (around 12/15/2017).    Thank you for the opportunity to care for this patient.  Please do not hesitate to contact me with questions.  Tonette BihariJ. A. Sammantha Mehlhaff, M.D.  [facility] [address] [place], [place] [address] [phone]

## 2016-12-15 NOTE — Patient Instructions (Addendum)
Cetirizine 10 mg once a day for runny nose Montelukast 10 mg once a day for coughing or wheezing Complete the course of amoxicillin  Ventolin 2 puffs every 4 hours if needed for wheezing or coughing spells Come in next week for her next vials Call me if she is not doing better on this treatment plan Continue on her other medications  Avoid tree nuts. If she has an allergic reaction give Benadryl 50 mg every 4 hours and if she has life-threatening symptoms inject  with EpiPen 0.3 mg

## 2016-12-16 ENCOUNTER — Ambulatory Visit (INDEPENDENT_AMBULATORY_CARE_PROVIDER_SITE_OTHER): Payer: Commercial Managed Care - PPO | Admitting: Family Medicine

## 2016-12-16 ENCOUNTER — Encounter: Payer: Self-pay | Admitting: Family Medicine

## 2016-12-16 DIAGNOSIS — Z6831 Body mass index (BMI) 31.0-31.9, adult: Secondary | ICD-10-CM | POA: Diagnosis not present

## 2016-12-16 DIAGNOSIS — E669 Obesity, unspecified: Secondary | ICD-10-CM | POA: Diagnosis not present

## 2016-12-16 NOTE — Patient Instructions (Addendum)
-   Good sleep hygiene includes going to bed at the same time each night, and getting up at the same time.    - Recommendation for sleep medicine physician:  Theressa MillardJames Osborne, MD.    - Look online for Epworth Sleepiness scale.    - Look into other information about ways to get better sleep.    - Liquid calories do not trigger our satiety center in the brain the way solid foods do.   - Oley BalmOrgain is a good protein drink product.  If you choose the powder, I recommend adding 1/2 dose to 8-12 oz of fat-free milk.    - TASTE PREFERENCES ARE LEARNED.  This means it will get easier to choose foods you know are good for you if you are exposed to them enough.       Make three lists of vegetables: (1) those you like and eat now; (2) vegetables you won't even consider; and (3) vegetables you might consider trying if they are prepared a certain way.  Continue to eat veg's you currently eat, but from this last list, choose a vegetable to try at least 3 times a week.  Use small amounts of this vegetable, cut small, combined with foods or seasonings you like.    Goals: 1. Eat at least 3 REAL meals and 1-2 snacks per day.  Aim for no more than 5 hours between eating.  Eat breakfast within one hour of getting up.   - A REAL meal includes at least some protein, some starch, and vegetables and/or fruit.    - OR: Would you serve this to a guest in your home, and call it a meal?  - A meal should look like, taste like, and feel like a meal, not a snack.    - Example of lunch: Chicken with zucchini and a starch.   2. Continue at least 30 min exercise 5 X wk, but be sure you get something to eat (protein + carb) either right before or right after exercise.

## 2016-12-16 NOTE — Progress Notes (Signed)
Medical Nutrition Therapy:  Appt start time: 1100 end time:  1200. PCP: Nadyne CoombesKaren Richter, MD  Assessment:  Primary concerns today: Weight management.  Dot LanesKrista was referred by Dr. Nadyne CoombesKaren Richter.  She has been doing [employer], she exercises 5 X wk, but none of these efforts have helped her lose weight.  She  has tried a keto diet, but weight loss stopped after 2 weeks.  Trials of metformin and phentermine has also been unhelpful.  She currently gets [employer] Wt Watcher points per day, and she follows this faithfully.  She records all food intake, exercise, and steps in the [employer] app.    Dot LanesKrista lives with her boyfriend, and they share dinner, which he usually makes.  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  Eva's boyfriend is not especially interested in healthier eating, but he has been trying to make healthier meals to help her out.    Dot LanesKrista has actually added food to her usual routine, following Dr. Wendee Coppichter's advice, who suspected she had been too restrictive in her diet.  I concur; intake is still very minimal and may be contributing to suppressed metabolic rate.    Learning Readiness: Ready  Usual eating pattern includes 3 meals and 2-3 snacks per day. Frequent foods and beverages include eggs, protein shakes, grapes, zucchini, chx, spinach, banana, lean protein foods (e.g., salmon), hummus, corn chips, water, diet Monster drinks.  Avoided foods include tree nuts and citrus fruit, pineapple, strawberries, onion (allergies), most veg's (likes spinach, broccoli, pureed cauliflower w/ garlic).    Usual physical activity includes elliptical 30 min 5-6 X wk.  Usually gets 11-8998 steps/day.    Sleep pattern: To bed 9/10:30; up at 6:30 or 7:30 on workdays.  Non-workdays gets up ~10 at earliest, and 1 PM at latest.  Dot LanesKrista is "always tired."  On Sundays, she sleeps late, till mid-day, then naps in the afternoon.  Always has trouble waking up.  Fatigue started when  she was about 15.  Sleep study in Feb 2018 suggested no sleep apnea.    24-hr recall: (Up at 10 AM) B (10:15 AM)-  2 boiled eggs, water Snk ( AM)-   water L (3 PM)-  8 oz Premier protein shake (30 g pro, 160 kcal)  Snk (4:30)-  1 tbsp hummus, 17 corn chips, water D (6 PM)-  1/2 c hibachi chx, 1/2 white rice, 2 tbsp white sauce, water Snk ( PM)-  water Typical day? No. Dinner is usually homemade, although yesterday was take-out.  Usually get at least 80 oz water per day.  Usually drinks a Monster drink once daily.    Progress Towards Goal(s):  In progress.   Nutritional Diagnosis:  NB-1.1 Food and nutrition-related knowledge deficit As related to weight management.  As evidenced by excessivelyy restrictive intake.    Intervention:  Nutrition education. Handouts given during visit include:  AVS  Demonstrated degree of understanding via:  Teach Back  Barriers to learning/adherence to lifestyle change: dislike of veg's.  Monitoring/Evaluation:  Dietary intake, exercise, and body weight in 9 week(s).

## 2016-12-17 ENCOUNTER — Encounter: Payer: Self-pay | Admitting: *Deleted

## 2016-12-17 DIAGNOSIS — J301 Allergic rhinitis due to pollen: Secondary | ICD-10-CM | POA: Diagnosis not present

## 2016-12-17 NOTE — Progress Notes (Signed)
2 maintenance vials made. Exp: 12/17/16. hc

## 2016-12-18 NOTE — Progress Notes (Signed)
Decrease Tree to green exp 12-17-17

## 2016-12-22 ENCOUNTER — Ambulatory Visit (INDEPENDENT_AMBULATORY_CARE_PROVIDER_SITE_OTHER): Payer: Commercial Managed Care - PPO | Admitting: *Deleted

## 2016-12-22 DIAGNOSIS — J309 Allergic rhinitis, unspecified: Secondary | ICD-10-CM

## 2016-12-22 NOTE — Progress Notes (Signed)
Immunotherapy   Patient Details  Name: Linah Klapper MRN: [id number] Date of Birth: 10/15/1990  12/22/2016  Edger House here to pick up  red vial weed-cat-dog and green vial tree.  Following schedule: A  Frequency:1 time per week Epi-Pen:Epi-Pen Available  Consent signed and patient instructions given.   Maurine Simmering 12/22/2016, 1:48 PM

## 2017-02-09 ENCOUNTER — Ambulatory Visit: Payer: Commercial Managed Care - PPO | Admitting: Family Medicine

## 2017-03-24 NOTE — Progress Notes (Signed)
VIALS EXP 03-25-18 

## 2017-03-25 DIAGNOSIS — J301 Allergic rhinitis due to pollen: Secondary | ICD-10-CM | POA: Diagnosis not present

## 2017-04-06 ENCOUNTER — Ambulatory Visit (INDEPENDENT_AMBULATORY_CARE_PROVIDER_SITE_OTHER): Payer: Commercial Managed Care - PPO

## 2017-04-06 DIAGNOSIS — J309 Allergic rhinitis, unspecified: Secondary | ICD-10-CM | POA: Diagnosis not present

## 2017-04-06 NOTE — Progress Notes (Signed)
Immunotherapy   Patient Details  Name: Hannah Mcmahon MRN: [id number] Date of Birth: 12/26/1990  04/06/2017  Hannah HouseKrista Mcmahon here to pick up GREEN 04/998 TREE & WEED-CAT-DOG @0 .10.  REPEATING GREEN VIALS DUE TO NUMEROUS REACTIONS. Following schedule: A  Frequency:1 time per week Epi-Pen:Epi-Pen Available  Consent signed and patient instructions given.   Murray HodgkinsMichelle Dolora Ridgely 04/06/2017, 2:13 PM

## 2017-06-15 ENCOUNTER — Ambulatory Visit (INDEPENDENT_AMBULATORY_CARE_PROVIDER_SITE_OTHER): Payer: Commercial Managed Care - PPO | Admitting: Pediatrics

## 2017-06-15 ENCOUNTER — Encounter: Payer: Self-pay | Admitting: Pediatrics

## 2017-06-15 VITALS — BP 100/60 | HR 72 | Temp 98.6°F | Resp 20

## 2017-06-15 DIAGNOSIS — J302 Other seasonal allergic rhinitis: Secondary | ICD-10-CM

## 2017-06-15 DIAGNOSIS — T7800XA Anaphylactic reaction due to unspecified food, initial encounter: Secondary | ICD-10-CM | POA: Diagnosis not present

## 2017-06-15 DIAGNOSIS — J3089 Other allergic rhinitis: Secondary | ICD-10-CM

## 2017-06-15 DIAGNOSIS — J4520 Mild intermittent asthma, uncomplicated: Secondary | ICD-10-CM

## 2017-06-15 MED ORDER — MOMETASONE FUROATE 50 MCG/ACT NA SUSP
2.0000 | Freq: Every day | NASAL | 5 refills | Status: DC
Start: 2017-06-15 — End: 2018-08-19

## 2017-06-15 NOTE — Patient Instructions (Addendum)
Cetirizine 10 mg once a day for runny nose Montelukast 10 mg once a day for coughing or wheezing Continue Nasacort nasal spray 1 spray in each nostril twice a day  Ventolin 2 puffs every 4 hours if needed for wheezing or coughing spells Reduce the Green Tree allergen vial to 0.3 ml for this week and increase as tolerated Call me if you are not doing better on this treatment plan Continue on her other medications  Avoid tree nuts. If she has an allergic reaction give Benadryl 50 mg every 4 hours and if she has life-threatening symptoms inject  with EpiPen 0.3 mg  Follow up in 6 months

## 2017-06-15 NOTE — Progress Notes (Addendum)
[address] [place] [address] Dept: [phone]  FOLLOW UP NOTE  Patient ID: Hannah Mcmahon, female    DOB: July 27, 1990  Age: 27 y.o. MRN: [id number] Date of Office Visit: 06/15/2017  Assessment  Chief Complaint: Allergic Rhinitis ; Asthma; and Shortness of Breath (but with no cough. started about 3 weeks ago but not all the time just randomly.)  HPI Lourene Hoston is a 27 year old female who presents today clinic for follow-up visit today.  She was last seen in this clinic on 12/15/2016 by Dr. Beaulah Dinning for evaluation of asthma and allergic rhinitis.  At that point her asthma and allergic rhinitis were reported as well controlled.  At that visit, she was taking amoxicillin for a sinus infection and had 5 days left on that prescription.  At today's visit, Annaleese reports her asthma as mostly well controlled with a couple of random episodes of shortness of breath while she is at work over the past 3 weeks.  She has used her albuterol inhaler about once a week which helps with the episodes of shortness of breath while at work.  She denies wheezing, coughing, limitation of activity, and nighttime awakenings due to asthma.  She continues to take montelukast 10 mg once a day at bedtime.  Allergic rhinitis is reported as moderately well controlled.  She does report having thick postnasal drainage and throat clearing that is year-round with no variation.  Over the last year she reports 2 sinus infections each requiring a round of amoxicillin 1 requiring prednisone for resolution of symptoms.  She reports both of her ears feel full of fluid and she is experiencing some dizziness which has improved over the last few days.  She denies fever, however, there are many sick contacts at her work.  She is currently using mometasone nasal spray 2 sprays in each nare once a day and taking cetirizine 10 mg once a day. She is receiving immunotherapy with large local reactions with the Green tree vial. She is  tolerating Green weed, cat, and dog vial with few reactions.   She continues to avoid tree nuts.  She has not had any accidental ingestion of tree nuts nor has she needed to use her epinephrine pen since her last visit here.  Her current medications are listed in the chart.    Drug Allergies:  Allergies  Allergen Reactions  . Citrus Other (See Comments)    Mild throat and hand swelling  . Other Swelling    Allergic to tree nuts-mild throat and hand swelling  . Yellow Dyes (Non-Tartrazine)     Bad headache hand and throat swelling.  Aleda Grana [Fluticasone Propionate] Rash    Broke out in rash on face    Physical Exam: BP 100/60 (BP Location: Right Arm, Patient Position: Sitting, Cuff Size: Normal)   Pulse 72   Temp 98.6 F (37 C) (Oral)   Resp 20   SpO2 98%    Physical Exam  Constitutional: She is oriented to person, place, and time. She appears well-developed and well-nourished.  HENT:  Bilateral ears with clear effusion.  Bilateral nares slightly erythematous and edematous with no drainage noted.  Eyes normal.  Pharynx normal.  Eyes: Conjunctivae are normal.  Neck: Normal range of motion. Neck supple.  Cardiovascular: Normal rate, regular rhythm and normal heart sounds.  S1-S2 normal.  No murmur noted.  Regular heart rate and rhythm.  Pulmonary/Chest: Effort normal and breath sounds normal.  Lungs clear to auscultation  Musculoskeletal: Normal range of  motion.  Neurological: She is alert and oriented to person, place, and time.  Skin: Skin is warm and dry.  Psychiatric: She has a normal mood and affect. Her behavior is normal. Judgment and thought content normal.    Diagnostics: FVC 4.31, FEV1 3.72.  Predicted FVC 4.05, predicted FEV1 3.47.  Spirometry is within the normal range.  Assessment and Plan: 1. Mild intermittent asthma without complication   2. Seasonal and perennial allergic rhinitis   3. Allergy with anaphylaxis due to food, initial encounter     Meds  ordered this encounter  Medications  . mometasone (NASONEX) 50 MCG/ACT nasal spray    Sig: Place 2 sprays into the nose daily. One spray each in each nostril twice a day    Dispense:  17 g    Refill:  5    Patient Instructions  Cetirizine 10 mg once a day for runny nose Montelukast 10 mg once a day for coughing or wheezing Continue Nasacort nasal spray 1 spray in each nostril twice a day  Ventolin 2 puffs every 4 hours if needed for wheezing or coughing spells Reduce the Green Tree allergen vial to 0.3 ml for this week and increase as tolerated Call me if you are not doing better on this treatment plan Continue on her other medications  Avoid tree nuts. If she has an allergic reaction give Benadryl 50 mg every 4 hours and if she has life-threatening symptoms inject  with EpiPen 0.3 mg  Follow up in 6 months  Edger HouseKrista Wolz was seen in the clinic with Dr. Beaulah DinningBardelas today. Thank you for the opportunity to care for this patient.  Please do not hesitate to contact me with questions.  Thermon LeylandAnne Ambs, FNP [facility]  I have provided oversight concerning Thermon Leylandnne Ambs' evaluation and treatment of this patient's health issues addressed during today's encounter. I agree with the assessment and therapeutic plan as outlined in the note.    Return in about 6 months (around 12/16/2017), or if symptoms worsen or fail to improve.    Thank you for the opportunity to care for this patient.  Please do not hesitate to contact me with questions.  Tonette BihariJ. A. Mohogany Toppins, M.D.  [facility] [place] [address] [place], [place] [phone]

## 2017-06-17 DIAGNOSIS — J301 Allergic rhinitis due to pollen: Secondary | ICD-10-CM | POA: Diagnosis not present

## 2017-06-17 NOTE — Progress Notes (Signed)
Exp. 06/18/18 

## 2017-06-29 ENCOUNTER — Ambulatory Visit (INDEPENDENT_AMBULATORY_CARE_PROVIDER_SITE_OTHER): Payer: Commercial Managed Care - PPO

## 2017-06-29 ENCOUNTER — Ambulatory Visit: Payer: Self-pay

## 2017-06-29 DIAGNOSIS — J309 Allergic rhinitis, unspecified: Secondary | ICD-10-CM | POA: Diagnosis not present

## 2017-06-29 NOTE — Progress Notes (Signed)
Here to start red vial pt keeps having reactions to tree so just administered 0.35 of greena nd did 0.05 of weed cat dog red

## 2017-07-20 ENCOUNTER — Ambulatory Visit: Payer: Self-pay

## 2017-07-27 ENCOUNTER — Ambulatory Visit (INDEPENDENT_AMBULATORY_CARE_PROVIDER_SITE_OTHER): Payer: Commercial Managed Care - PPO

## 2017-07-27 DIAGNOSIS — J309 Allergic rhinitis, unspecified: Secondary | ICD-10-CM

## 2017-07-27 NOTE — Progress Notes (Signed)
Immunotherapy   Patient Details  Name: Hannah HouseKrista Mcmahon MRN: [id number] Date of Birth: [birth year]  07/27/2017  Hannah HouseKrista Mcmahon  Pt here to pick up here second green vial of tree Following schedule: A  Frequency:once a week Epi-Pen:yes Consent signed and patient instructions given.   Berna BueCarrie L Yaviel Kloster 07/27/2017, 2:11 PM

## 2017-08-31 ENCOUNTER — Other Ambulatory Visit: Payer: Self-pay | Admitting: Pediatrics

## 2017-10-05 ENCOUNTER — Other Ambulatory Visit: Payer: Self-pay | Admitting: *Deleted

## 2017-10-05 MED ORDER — "TUBERCULIN SYRINGE 27G X 1/2"" 1 ML MISC": 3 refills | Status: AC

## 2017-10-19 ENCOUNTER — Telehealth: Payer: Self-pay | Admitting: *Deleted

## 2017-10-19 NOTE — Telephone Encounter (Signed)
Patient's mom called and stated patient was out of syringes and has not had a shot since 08/24/17. Last dose of Weed-Cat-Dog was 0.40 and Tree was 0.20. Informed patients mom to drop Weed-Cat-Dog down to 0.20 and drop Tree down to 0.05. Mom understood and agreed with plan.

## 2017-11-11 ENCOUNTER — Other Ambulatory Visit: Payer: Self-pay | Admitting: Family Medicine

## 2017-11-11 DIAGNOSIS — R109 Unspecified abdominal pain: Secondary | ICD-10-CM

## 2017-11-12 ENCOUNTER — Ambulatory Visit
Admission: RE | Admit: 2017-11-12 | Discharge: 2017-11-12 | Disposition: A | Discharge status: Home / Self Care | Payer: BLUE CROSS/BLUE SHIELD | Source: Ambulatory Visit | Attending: Family Medicine | Admitting: Family Medicine

## 2017-11-12 DIAGNOSIS — R109 Unspecified abdominal pain: Secondary | ICD-10-CM

## 2017-12-21 ENCOUNTER — Encounter: Payer: Self-pay | Admitting: Pediatrics

## 2017-12-21 ENCOUNTER — Ambulatory Visit (INDEPENDENT_AMBULATORY_CARE_PROVIDER_SITE_OTHER): Payer: BLUE CROSS/BLUE SHIELD | Admitting: Pediatrics

## 2017-12-21 VITALS — BP 96/66 | HR 100 | Temp 98.2°F | Resp 16

## 2017-12-21 DIAGNOSIS — J301 Allergic rhinitis due to pollen: Secondary | ICD-10-CM | POA: Diagnosis not present

## 2017-12-21 DIAGNOSIS — J4530 Mild persistent asthma, uncomplicated: Secondary | ICD-10-CM

## 2017-12-21 DIAGNOSIS — T7800XD Anaphylactic reaction due to unspecified food, subsequent encounter: Secondary | ICD-10-CM

## 2017-12-21 NOTE — Progress Notes (Signed)
  [address] [place] [id number] Dept: [phone]  FOLLOW UP NOTE  Patient ID: Hannah Mcmahon, female    DOB: 26-Apr-1990  Age: 27 y.o. MRN: [id number] Date of Office Visit: 12/21/2017  Assessment  Chief Complaint: Asthma  HPI Hannah HouseKrista Mcmahon presents for follow-up of asthma and allergic rhinitis.  Her asthma is well controlled with montelukast 10 mg once a day.  She had a sinus infection 3 weeks ago and another one 6 weeks prior to that time and was treated with an antibiotic.  She did not receive prednisone.  She has had prednisone about 4 times in the past year she is doing well with her allergy injections but sometimes has some local swelling from the tree pollen vial   Drug Allergies:  Allergies  Allergen Reactions  . Citrus Other (See Comments)    Mild throat and hand swelling  . Other Swelling    Allergic to tree nuts-mild throat and hand swelling  . Yellow Dyes (Non-Tartrazine)     Bad headache hand and throat swelling.  Aleda Grana. Flonase [Fluticasone Propionate] Rash    Broke out in rash on face    Physical Exam: BP 96/66   Pulse 100   Temp 98.2 F (36.8 C) (Oral)   Resp 16   SpO2 97%    Physical Exam  Constitutional: She is oriented to person, place, and time. She appears well-developed and well-nourished.  HENT:  Eyes normal.  Ears normal.  Nose mild swelling of nasal turbinates.  Pharynx normal.  Neck: Neck supple.  Cardiovascular:  S1-S2 normal no murmurs  Pulmonary/Chest:  Clear to percussion and auscultation  Lymphadenopathy:    She has no cervical adenopathy.  Neurological: She is alert and oriented to person, place, and time.  Psychiatric: She has a normal mood and affect. Her behavior is normal. Judgment and thought content normal.  Vitals reviewed.   Diagnostics: FVC 4.19 L FEV1 3.63 L.  Predicted FVC 4.05 L predicted FEV1 3.45 L-the spirometry is in the normal range  Assessment and Plan: 1. Anaphylactic shock due to food, subsequent encounter     2. Mild persistent asthma without complication   3. Seasonal allergic rhinitis due to pollen        Patient Instructions  Cetirizine 10 mg once a day for a runny nose Nasal saline irrigations at night followed by Nasacort 1 spray per nostril twice a day Montelukast 10 mg once a day to prevent coughing or wheezing Ventolin 2 puffs every 4 hours if needed for wheezing or coughing spells Continue on allergy injections I recommend an ENT exam  with a sinus endoscopy to evaluate your recurrent sinus infections Continue on your other medications Call us if you are not doing well on this treatment plan  Avoid tree nuts.  If you have an allergic reaction take Benadryl 50 mg every 4 hours and if you have life-threatening symptoms inject with EpiPen 0.3 mg   Return in about 6 months (around 06/21/2018).    Thank you for the opportunity to care for this patient.  Please do not hesitate to contact me with questions.  Tonette BihariJ. A. Brigg Cape, M.D.  [facility] [place] [address] [place], [place] [address] [phone]

## 2017-12-21 NOTE — Patient Instructions (Addendum)
Cetirizine 10 mg once a day for a runny nose Nasal saline irrigations at night followed by Nasacort 1 spray per nostril twice a day Montelukast 10 mg once a day to prevent coughing or wheezing Ventolin 2 puffs every 4 hours if needed for wheezing or coughing spells Continue on allergy injections I recommend an ENT exam  with a sinus endoscopy to evaluate your recurrent sinus infections Continue on your other medications Call us if you are not doing well on this treatment plan  Avoid tree nuts.  If you have an allergic reaction take Benadryl 50 mg every 4 hours and if you have life-threatening symptoms inject with EpiPen 0.3 mg

## 2017-12-22 ENCOUNTER — Encounter: Payer: Self-pay | Admitting: *Deleted

## 2017-12-22 ENCOUNTER — Ambulatory Visit (INDEPENDENT_AMBULATORY_CARE_PROVIDER_SITE_OTHER): Payer: BLUE CROSS/BLUE SHIELD | Admitting: *Deleted

## 2017-12-22 DIAGNOSIS — J309 Allergic rhinitis, unspecified: Secondary | ICD-10-CM

## 2017-12-22 NOTE — Progress Notes (Signed)
Immunotherapy   Patient Details  Name: Hannah HouseKrista Mcmahon MRN: [id number] Date of Birth: Feb 02, 1991  12/22/2017  Hannah Mcmahon here to pick up  tree greens vial Following schedule: A  Frequency:1 time per week Epi-Pen:Epi-Pen Available  Consent signed and patient instructions given.  Pt picked up 1 vial, she still had serum in the other. pts mother is an Charity fundraiserN and administers her shots.   Maurine SimmeringLogan D Arlissa Monteverde 12/22/2017, 9:41 AM

## 2017-12-22 NOTE — Addendum Note (Signed)
Addended by: Virl SonGAINEY, DAMITA D on: 12/22/2017 11:20 AM   Modules accepted: Orders

## 2017-12-22 NOTE — Progress Notes (Unsigned)
Dilute TREE to green label printed.

## 2018-01-12 ENCOUNTER — Ambulatory Visit: Payer: Self-pay

## 2018-01-18 ENCOUNTER — Ambulatory Visit (INDEPENDENT_AMBULATORY_CARE_PROVIDER_SITE_OTHER): Payer: BLUE CROSS/BLUE SHIELD

## 2018-01-18 DIAGNOSIS — J309 Allergic rhinitis, unspecified: Secondary | ICD-10-CM | POA: Diagnosis not present

## 2018-01-18 NOTE — Progress Notes (Signed)
Immunotherapy   Patient Details  Name: Rhianon Zabawa MRN: [id number] Date of Birth: [birth year]  01/18/2018  Edger House here to pick up Red 1:100 ( W-C-D) only Following schedule: A  Frequency:1 time per week Epi-Pen:Epi-Pen Available  Consent signed and patient instructions given.   Damita Gainey 01/18/2018, 11:20 AM

## 2018-06-21 ENCOUNTER — Ambulatory Visit: Payer: BLUE CROSS/BLUE SHIELD | Admitting: Pediatrics

## 2018-08-19 ENCOUNTER — Other Ambulatory Visit: Payer: Self-pay | Admitting: Pediatrics

## 2018-08-19 ENCOUNTER — Other Ambulatory Visit: Payer: Self-pay | Admitting: Family Medicine

## 2018-10-04 ENCOUNTER — Telehealth: Payer: Self-pay | Admitting: Pediatrics

## 2018-10-04 MED ORDER — AZELASTINE HCL 0.1 % NA SOLN: 2.0000 | Freq: Two times a day (BID) | NASAL | 5 refills | Status: AC | PRN

## 2018-10-04 NOTE — Telephone Encounter (Signed)
LVM to return call.

## 2018-10-04 NOTE — Telephone Encounter (Signed)
Nasonex is not covered and looking for alternative. States she has [employer]	[place] and will email card copy. Allergic to flonase.  [phone]

## 2018-10-04 NOTE — Telephone Encounter (Signed)
Pt last seen 12/2017. It appears azelastine is tier 1. Please advise.

## 2018-10-04 NOTE — Telephone Encounter (Signed)
Azelastine 0.1%-2 sprays per nostril twice a day for runny nose if needed.  Or she could try Nasacort 1 spray per nostril twice a day over-the-counter which is relatively inexpensive.  She can pick up 2 samples of Nasacort

## 2018-10-04 NOTE — Telephone Encounter (Signed)
Pt returned call- sent in azelastine. If for whatever reason it doesn't not go through then she will do the Nasacort.

## 2019-02-01 ENCOUNTER — Encounter (INDEPENDENT_AMBULATORY_CARE_PROVIDER_SITE_OTHER): Payer: Self-pay

## 2019-02-16 ENCOUNTER — Other Ambulatory Visit: Payer: Self-pay

## 2019-02-16 DIAGNOSIS — Z20822 Contact with and (suspected) exposure to covid-19: Secondary | ICD-10-CM

## 2019-02-18 LAB — NOVEL CORONAVIRUS, NAA: SARS-CoV-2, NAA: NOT DETECTED

## 2019-07-12 IMAGING — US US ABDOMEN LIMITED
1 series · 14 of 25 positions shown · non-contrast
Comparison: None.

CLINICAL DATA: 27-year-old female with abdominal pain.

EXAM:
ULTRASOUND ABDOMEN LIMITED RIGHT UPPER QUADRANT

[Series 1: us abdomen limited · 0.23mm/px · 14 of 43 slices shown]
[im 1/43]
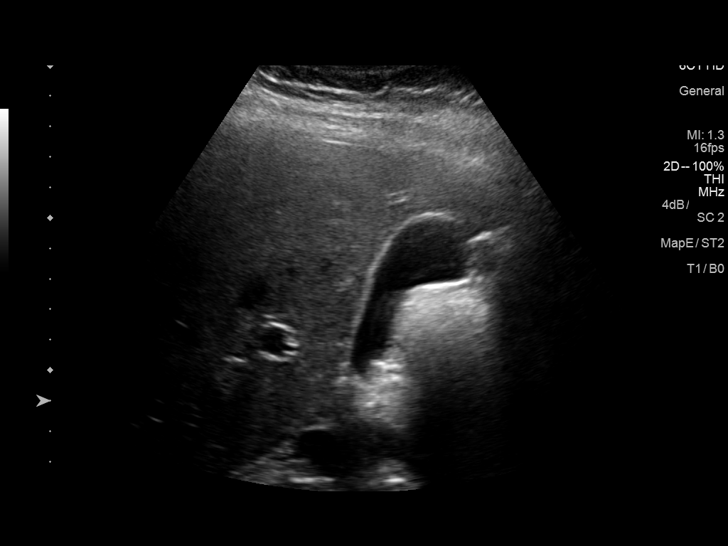
[im 4/43]
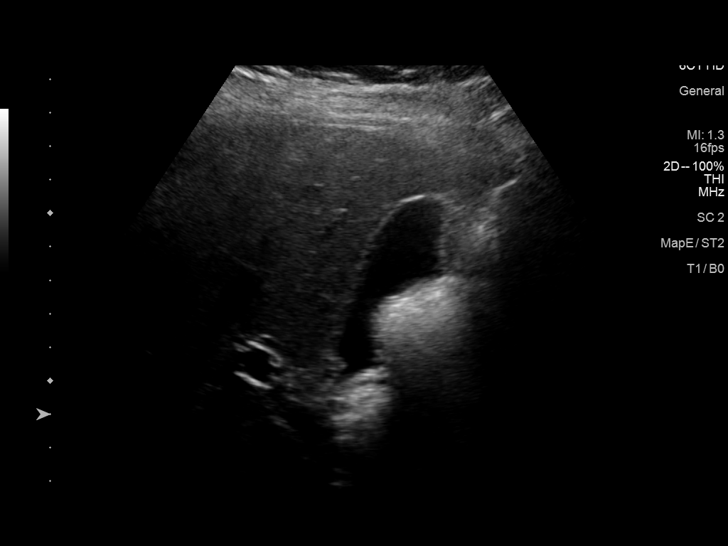
[im 8/43]
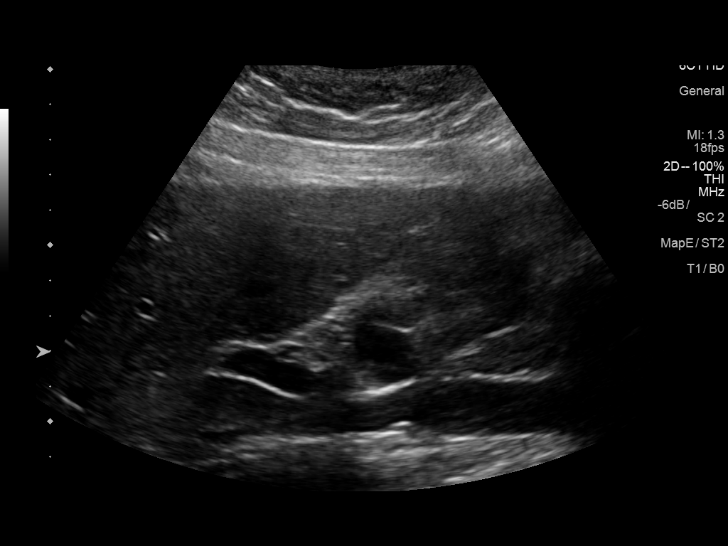
[im 11/43]
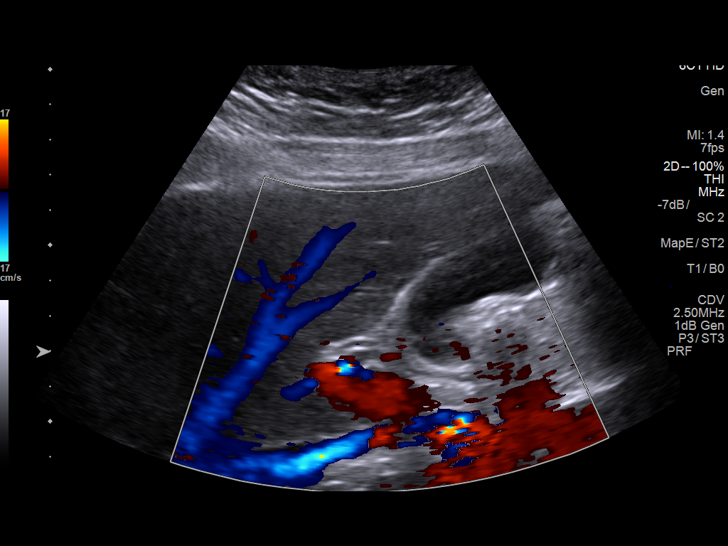
[im 15/43]
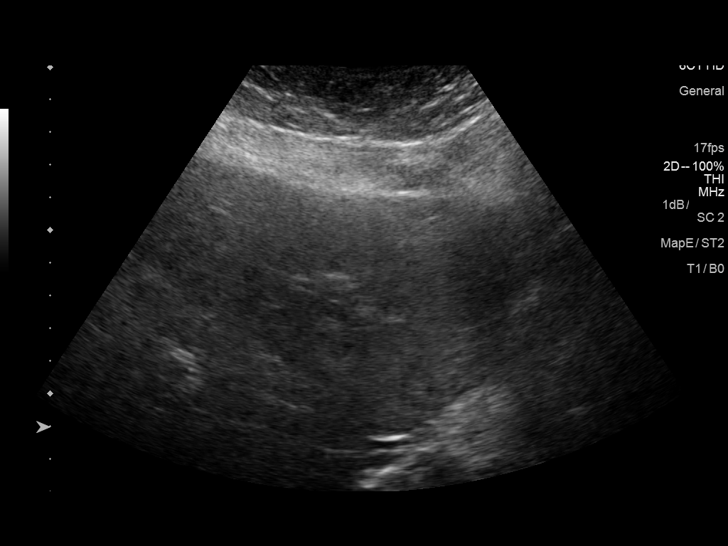
[im 16/43]
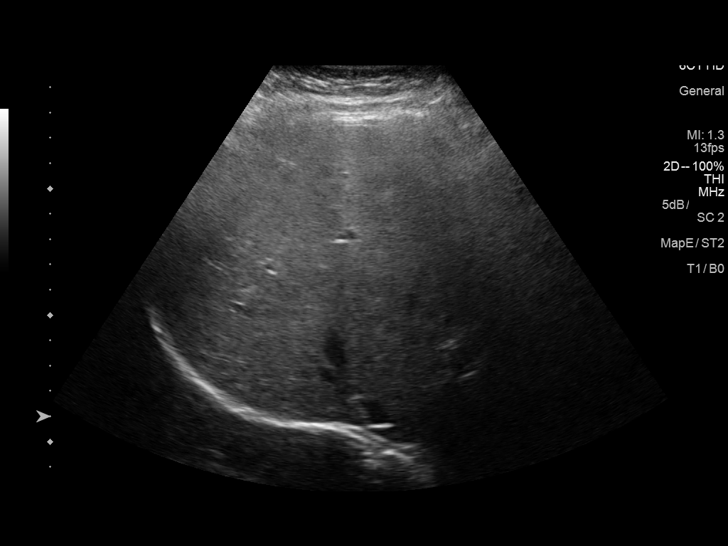
[im 20/43]
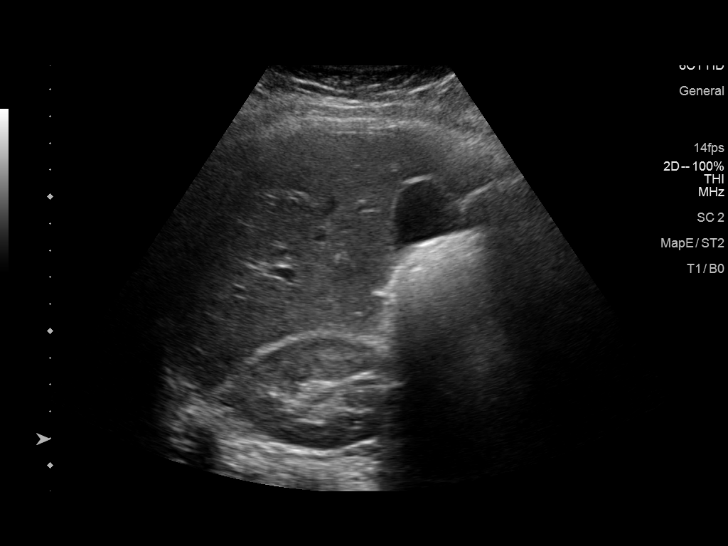
[im 23/43]
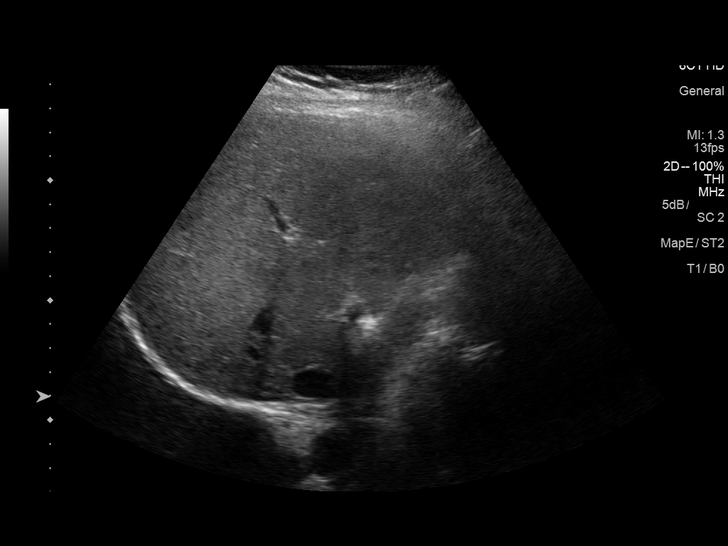
[im 27/43]
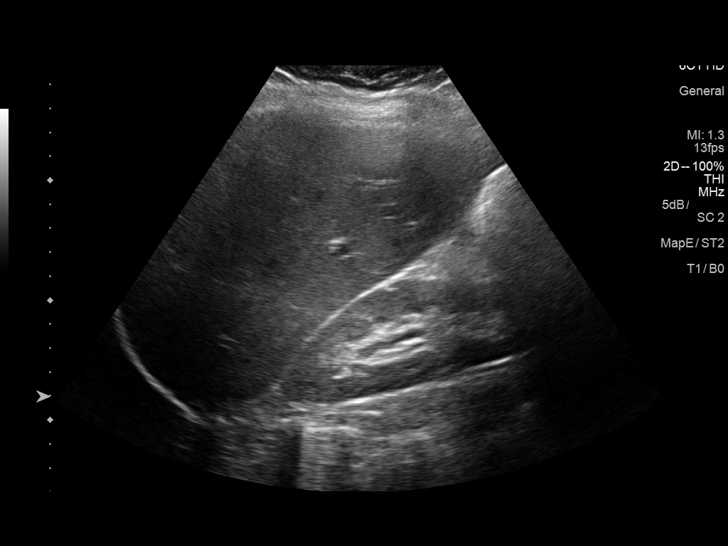
[im 29/43]
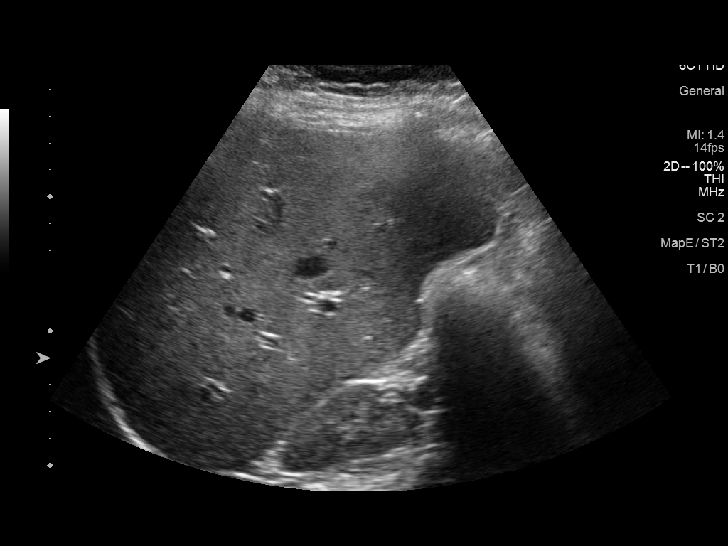
[im 32/43]
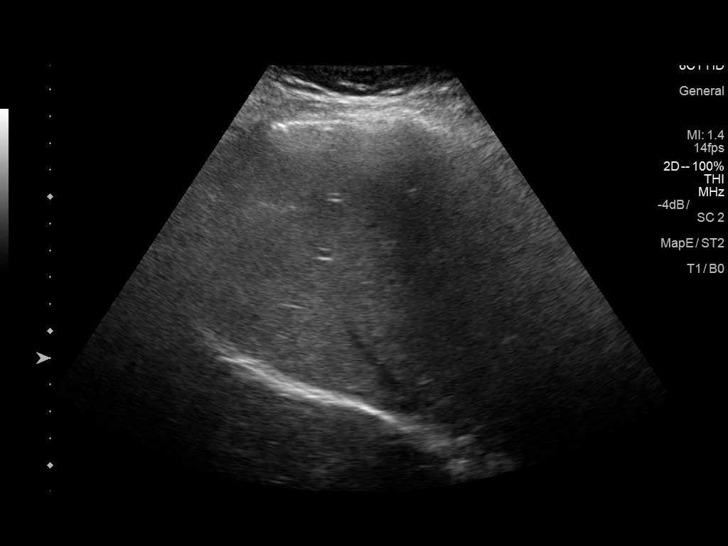
[im 36/43]
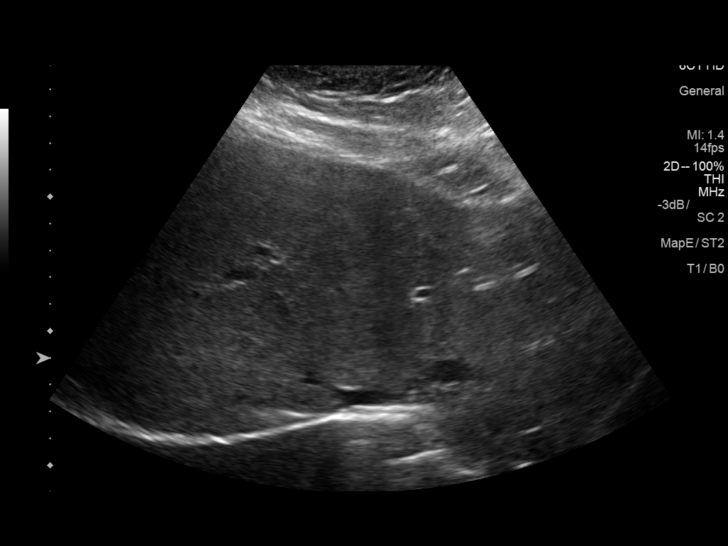
[im 39/43]
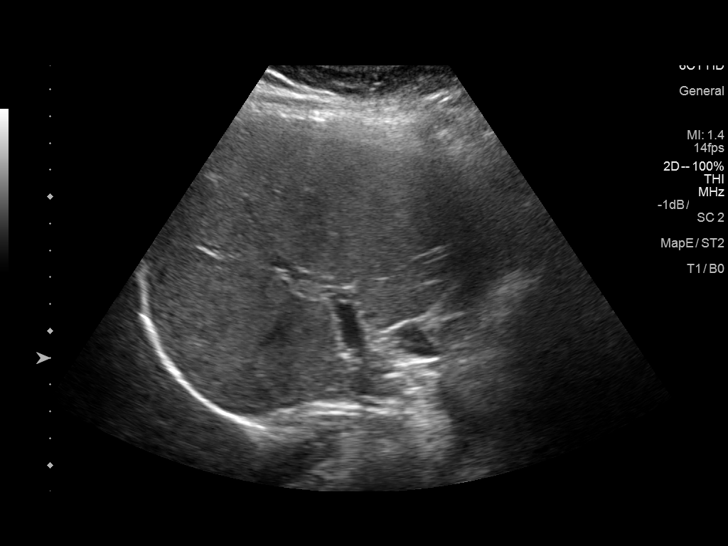
[im 43/43]
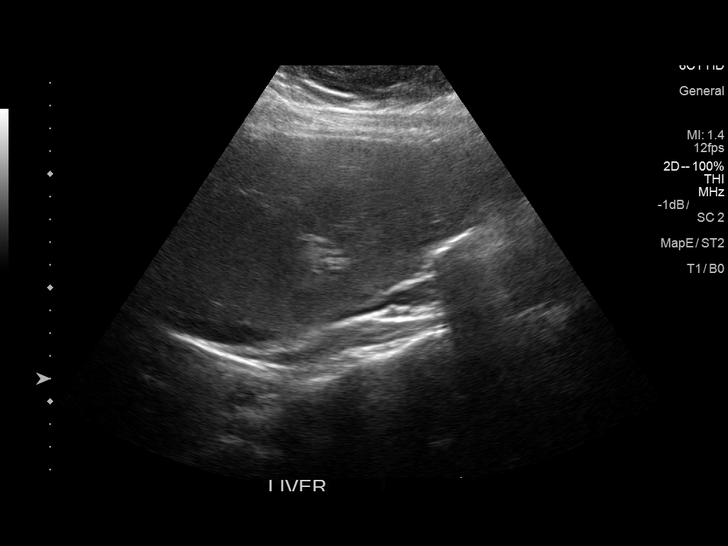

[14 of 25 positions shown; findings below may reference images not displayed]

FINDINGS: Gallbladder:

The gallbladder is unremarkable. There is no evidence of
cholelithiasis or acute cholecystitis.

Common bile duct:

Diameter: 3 mm.  No intrahepatic or extrahepatic biliary dilatation.

Liver:

No focal lesion identified. Within normal limits in parenchymal
echogenicity. Portal vein is patent on color Doppler imaging with
normal direction of blood flow towards the liver.
IMPRESSION: Normal RIGHT UPPER quadrant abdominal ultrasound.

## 2022-11-29 ENCOUNTER — Encounter (HOSPITAL_COMMUNITY): Payer: Self-pay

## 2022-11-29 ENCOUNTER — Emergency Department (HOSPITAL_COMMUNITY)
Admission: EM | Admit: 2022-11-29 | Discharge: 2022-11-29 | Disposition: A | Discharge status: Home / Self Care | Payer: 59 | Attending: Emergency Medicine | Admitting: Emergency Medicine

## 2022-11-29 ENCOUNTER — Other Ambulatory Visit: Payer: Self-pay

## 2022-11-29 ENCOUNTER — Emergency Department (HOSPITAL_COMMUNITY): Payer: 59

## 2022-11-29 DIAGNOSIS — Z20822 Contact with and (suspected) exposure to covid-19: Secondary | ICD-10-CM | POA: Insufficient documentation

## 2022-11-29 DIAGNOSIS — R519 Headache, unspecified: Secondary | ICD-10-CM | POA: Insufficient documentation

## 2022-11-29 DIAGNOSIS — R112 Nausea with vomiting, unspecified: Secondary | ICD-10-CM | POA: Diagnosis present

## 2022-11-29 DIAGNOSIS — J45909 Unspecified asthma, uncomplicated: Secondary | ICD-10-CM | POA: Diagnosis not present

## 2022-11-29 LAB — COMPREHENSIVE METABOLIC PANEL
ALT: 16 U/L (ref 0–44)
AST: 16 U/L (ref 15–41)
Albumin: 4.5 g/dL (ref 3.5–5.0)
Alkaline Phosphatase: 39 U/L (ref 38–126)
Anion gap: 9 (ref 5–15)
BUN: 9 mg/dL (ref 6–20)
CO2: 24 mmol/L (ref 22–32)
Calcium: 9.1 mg/dL (ref 8.9–10.3)
Chloride: 102 mmol/L (ref 98–111)
Creatinine, Ser: 0.54 mg/dL (ref 0.44–1.00)
GFR, Estimated: >60 mL/min (ref >60)
Glucose, Bld: 84 mg/dL (ref 70–99)
Potassium: 3.7 mmol/L (ref 3.5–5.1)
Sodium: 135 mmol/L (ref 135–145)
Total Bilirubin: 0.9 mg/dL (ref 0.3–1.2)
Total Protein: 7.4 g/dL (ref 6.5–8.1)

## 2022-11-29 LAB — CBC
HCT: 40.2 % (ref 36.0–46.0)
Hemoglobin: 13.6 g/dL (ref 12.0–15.0)
MCH: 29.4 pg (ref 26.0–34.0)
MCHC: 33.8 g/dL (ref 30.0–36.0)
MCV: 86.8 fL (ref 80.0–100.0)
Platelets: 279 10*3/uL (ref 150–400)
RBC: 4.63 MIL/uL (ref 3.87–5.11)
RDW: 12.5 % (ref 11.5–15.5)
WBC: 10.6 10*3/uL — ABNORMAL HIGH (ref 4.0–10.5)
nRBC: 0 % (ref 0.0–0.2)

## 2022-11-29 LAB — RESP PANEL BY RT-PCR (RSV, FLU A&B, COVID)  RVPGX2
Influenza A by PCR: NEGATIVE
Influenza B by PCR: NEGATIVE
Resp Syncytial Virus by PCR: NEGATIVE
SARS Coronavirus 2 by RT PCR: NEGATIVE

## 2022-11-29 LAB — LIPASE, BLOOD: Lipase: 26 U/L (ref 11–51)

## 2022-11-29 LAB — HCG, SERUM, QUALITATIVE: Preg, Serum: NEGATIVE

## 2022-11-29 MED ORDER — ONDANSETRON 4 MG PO TBDP
4.0000 mg | ORAL_TABLET | Freq: Once | ORAL | Status: AC | PRN
  Administered 2022-11-29: 4 mg via ORAL
  Filled 2022-11-29: qty 1

## 2022-11-29 MED ORDER — IOHEXOL 350 MG/ML SOLN
75.0000 mL | Freq: Once | INTRAVENOUS | Status: AC | PRN
  Administered 2022-11-29: 75 mL via INTRAVENOUS

## 2022-11-29 MED ORDER — LACTATED RINGERS IV BOLUS
1000.0000 mL | Freq: Once | INTRAVENOUS | Status: AC
  Administered 2022-11-29: 1000 mL via INTRAVENOUS

## 2022-11-29 MED ORDER — ONDANSETRON 4 MG PO TBDP: 4.0000 mg | ORAL_TABLET | Freq: Three times a day (TID) | ORAL | 0 refills | Status: AC | PRN

## 2022-11-29 MED ORDER — METOCLOPRAMIDE HCL 5 MG/ML IJ SOLN
10.0000 mg | Freq: Once | INTRAMUSCULAR | Status: AC
  Administered 2022-11-29: 10 mg via INTRAVENOUS
  Filled 2022-11-29: qty 2

## 2022-11-29 NOTE — Discharge Instructions (Signed)
Your CT scan and lab work today were reassuring.  I recommend you call your doctor Monday to schedule follow-up.  If you develop persistent vomiting, severe abdominal pain, severe headache or any neurologic changes you should return to the ED.

## 2022-11-29 NOTE — ED Provider Notes (Signed)
[place] EMERGENCY DEPARTMENT AT [facility] Provider Note   CSN: [id number] Arrival date & time: 11/29/22  1843     History  Chief Complaint  Patient presents with   Emesis   Headache    Hannah Mcmahon is a 32 y.o. female.   Emesis Associated symptoms: headaches   Headache Associated symptoms: vomiting   32 year old female history of anxiety, asthma presenting for headache, vomiting.  Patient reports yesterday she developed nausea and vomiting.  She has had multiple thromboembolus emesis.  She woke up with headache today.  It is frontal, severe, unsure if it was sudden onset.  She has had persistent nausea and vomiting today.  No abdominal pain, diarrhea.  Normal bowel movements.  No fever or chills.  No urinary symptoms.  No chest pain or shortness of breath.  No dizziness.  No vision changes, weakness, numbness.  No history of migraines.  She has no neck stiffness.     Home Medications Prior to Admission medications   Medication Sig Start Date End Date Taking? Authorizing Provider  ondansetron (ZOFRAN-ODT) 4 MG disintegrating tablet Take 1 tablet (4 mg total) by mouth every 8 (eight) hours as needed. 11/29/22  Yes Laurence Spates, MD  albuterol (VENTOLIN HFA) 108 (90 Base) MCG/ACT inhaler Inhale 2 puffs into the lungs every 4 (four) hours as needed for wheezing or shortness of breath. 10/22/15   Fletcher Anon, MD  azelastine (ASTELIN) 0.1 % nasal spray Place 2 sprays into both nostrils 2 (two) times daily as needed. 10/04/18   Fletcher Anon, MD  cetirizine (ZYRTEC) 5 MG tablet Take 5 mg by mouth daily.    [provider]  clonazePAM (KLONOPIN) 0.5 MG tablet Take 0.5 mg by mouth as needed for anxiety.    [provider]  EPINEPHrine 0.3 mg/0.3 mL IJ SOAJ injection Inject 0.3 mLs (0.3 mg total) into the muscle once. 10/22/15   Fletcher Anon, MD  escitalopram (LEXAPRO) 10 MG tablet Take 10 mg by mouth daily.    [provider]   lamoTRIgine (LAMICTAL) 200 MG tablet Take 200 mg by mouth daily.    [provider]  liraglutide (VICTOZA) 18 MG/3ML SOPN Inject into the skin.    [provider]  mometasone (NASONEX) 50 MCG/ACT nasal spray PLACE 2 SPRAYS INTO THE NOSE DAILY. ONE SPRAY EACH IN EACH NOSTRIL TWICE A DAY 08/19/18   Bardelas, Jose A, MD  montelukast (SINGULAIR) 10 MG tablet Take 10 mg by mouth at bedtime.    [provider]  Multiple Vitamins-Minerals (MULTIVITAMIN WOMEN PO) Take 1 tablet by mouth daily.    [provider]  NONFORMULARY OR COMPOUNDED ITEM Allergy Injections    [provider]  Norgestimate-Ethinyl Estradiol Triphasic (TRI-LO-MARZIA) 0.18/0.215/0.25 MG-25 MCG tab Take 1 tablet by mouth daily.    [provider]  Prasterone, DHEA, (DHEA 50 PO) Take by mouth.    [provider]  ranitidine (ZANTAC) 150 MG tablet Take 150 mg by mouth 2 (two) times daily.    [provider]  sucralfate (CARAFATE) 1 g tablet Take 1 g by mouth 4 (four) times daily -  with meals and at bedtime.    [provider]  TUBERCULIN SYR 1CC/27GX1/2" (B-D TB SYRINGE 1CC/27GX1/2") 27G X 1/2" 1 ML MISC USE AS DIRECTED WITH ALLERGY INJECTIONS. 10/05/17   Fletcher Anon, MD      Allergies    Citrus, Other, Yellow dyes (non-tartrazine), and Flonase [fluticasone propionate]  Review of Systems   Review of Systems  Gastrointestinal:  Positive for vomiting.  Neurological:  Positive for headaches.  Review of systems completed and notable as per HPI.  ROS otherwise negative.   Physical Exam Updated Vital Signs BP 98/69   Pulse 72   Temp 98.6 F (37 C) (Oral)   Resp 18   Ht 5\' 6"  (1.676 m)   Wt 74.8 kg   SpO2 100%   BMI 26.63 kg/m  Physical Exam Vitals and nursing note reviewed.  Constitutional:      General: She is not in acute distress.    Appearance: She is well-developed.  HENT:     Head: Normocephalic and atraumatic.     Nose: Nose  normal.     Mouth/Throat:     Mouth: Mucous membranes are moist.     Pharynx: Oropharynx is clear.  Eyes:     Extraocular Movements: Extraocular movements intact.     Conjunctiva/sclera: Conjunctivae normal.     Pupils: Pupils are equal, round, and reactive to light.  Neck:     Meningeal: Kernig's sign absent.  Cardiovascular:     Rate and Rhythm: Normal rate and regular rhythm.     Heart sounds: No murmur heard. Pulmonary:     Effort: Pulmonary effort is normal. No respiratory distress.     Breath sounds: Normal breath sounds.  Abdominal:     Palpations: Abdomen is soft.     Tenderness: There is no abdominal tenderness. There is no right CVA tenderness, left CVA tenderness, guarding or rebound.  Musculoskeletal:        General: No swelling.     Cervical back: Normal range of motion and neck supple. No rigidity.  Skin:    General: Skin is warm and dry.     Capillary Refill: Capillary refill takes less than 2 seconds.  Neurological:     General: No focal deficit present.     Mental Status: She is alert and oriented to person, place, and time. Mental status is at baseline.     GCS: GCS eye subscore is 4. GCS verbal subscore is 5. GCS motor subscore is 6.     Cranial Nerves: No cranial nerve deficit.     Sensory: No sensory deficit.     Motor: No weakness.  Psychiatric:        Mood and Affect: Mood normal.     ED Results / Procedures / Treatments   Labs (all labs ordered are listed, but only abnormal results are displayed) Labs Reviewed  CBC - Abnormal; Notable for the following components:      Result Value   WBC 10.6 (*)    All other components within normal limits  RESP PANEL BY RT-PCR (RSV, FLU A&B, COVID)  RVPGX2  LIPASE, BLOOD  COMPREHENSIVE METABOLIC PANEL  HCG, SERUM, QUALITATIVE  URINALYSIS, ROUTINE W REFLEX MICROSCOPIC    EKG EKG Interpretation Date/Time:  Saturday November 29 2022 20:47:11 EDT Ventricular Rate:  80 PR Interval:  132 QRS  Duration:  117 QT Interval:  400 QTC Calculation: 462 R Axis:   83  Text Interpretation: Sinus rhythm Nonspecific intraventricular conduction delay Confirmed by Fulton Reek [phone]) on 11/29/2022 8:51:01 PM  Radiology CT ANGIO HEAD NECK W WO CM  Result Date: 11/29/2022 CLINICAL DATA:  Nausea and vomiting since yesterday, headache starting today EXAM: CT ANGIOGRAPHY HEAD AND NECK WITH AND WITHOUT CONTRAST TECHNIQUE: Multidetector CT imaging of the head and neck was performed using the standard protocol during  bolus administration of intravenous contrast. Multiplanar CT image reconstructions and MIPs were obtained to evaluate the vascular anatomy. Carotid stenosis measurements (when applicable) are obtained utilizing NASCET criteria, using the distal internal carotid diameter as the denominator. RADIATION DOSE REDUCTION: This exam was performed according to the departmental dose-optimization program which includes automated exposure control, adjustment of the mA and/or kV according to patient size and/or use of iterative reconstruction technique. CONTRAST:  75mL OMNIPAQUE IOHEXOL 350 MG/ML SOLN COMPARISON:  None Available. FINDINGS: CT HEAD FINDINGS Brain: No evidence of acute infarct, hemorrhage, mass, mass effect, or midline shift. No hydrocephalus or extra-axial fluid collection. Vascular: No hyperdense vessel. Skull: Negative for fracture or focal lesion. Sinuses/Orbits: No acute finding. Other: The mastoid air cells are well aerated. CTA NECK FINDINGS Aortic arch: Three-vessel arch, with a common origin of the brachiocephalic and left common carotid artery, and aortic origin of the left vertebral artery. Imaged portion shows no evidence of aneurysm or dissection. No significant stenosis of the branch vessel origins. Right carotid system: No evidence of stenosis, dissection, or occlusion. Left carotid system: No evidence of stenosis, dissection, or occlusion. Vertebral arteries: No evidence of  stenosis, dissection, or occlusion. Right dominant system. Skeleton: No acute osseous abnormality. Degenerative changes in the cervical spine. Other neck: No acute finding. Upper chest: No focal pulmonary opacity or pleural effusion. Review of the MIP images confirms the above findings CTA HEAD FINDINGS Anterior circulation: Both internal carotid arteries are patent to the termini, without significant stenosis. A1 segments patent. Normal anterior communicating artery. Anterior cerebral arteries are patent to their distal aspects without significant stenosis. No M1 stenosis or occlusion. MCA branches perfused to their distal aspects without significant stenosis. Posterior circulation: Vertebral arteries patent to the vertebrobasilar junction without significant stenosis. Posterior inferior cerebellar artery is visualized on the right. There is likely a common origin of the left AICA and PICA. Basilar patent to its distal aspect without significant stenosis. Superior cerebellar arteries patent proximally. Patent P1 segments. PCAs perfused to their distal aspects without significant stenosis. The right posterior communicating artery is patent. Venous sinuses: Well opacified, patent. Anatomic variants: None significant. Review of the MIP images confirms the above findings IMPRESSION: 1. No acute intracranial process. No etiology is seen for the patient's symptoms. 2. No intracranial large vessel occlusion or significant stenosis. 3. No hemodynamically significant stenosis in the neck. Electronically Signed   By: Wiliam Ke M.D.   On: 11/29/2022 22:02    Procedures Procedures    Medications Ordered in ED Medications  ondansetron (ZOFRAN-ODT) disintegrating tablet 4 mg (4 mg Oral Given 11/29/22 1859)  lactated ringers bolus 1,000 mL (1,000 mLs Intravenous New Bag/Given 11/29/22 2033)  metoCLOPramide (REGLAN) injection 10 mg (10 mg Intravenous Given 11/29/22 2034)  iohexol (OMNIPAQUE) 350 MG/ML injection 75 mL  (75 mLs Intravenous Contrast Given 11/29/22 2126)    ED Course/ Medical Decision Making/ A&P                                 Medical Decision Making Amount and/or Complexity of Data Reviewed Labs: ordered. Radiology: ordered.  Risk Prescription drug management.   Medical Decision Making:   Hannah Mcmahon is a 32 y.o. female who presented to the ED today with headache, vomiting.  Vital signs reviewed.  Exam she is well-appearing, slightly uncomfortable.  She reports 2 days of vomiting as well as severe headache today.  Unclear if it was sudden onset  or not, and started this morning over 12 hours ago.  Given this, will obtain CTA to evaluate for aneurysm, subarachnoid, intracranial cause of her headache.  She has no fever or meningismus, no signs of CNS infection.  No abdominal pain or tenderness with benign exam, low concern for obstruction, cholecystitis, appendicitis or other acute intra-abdominal pathology.  Suspect possible gastroenteritis versus reactive in setting of headache.   Patient placed on continuous vitals and telemetry monitoring while in ED which was reviewed periodically.  Reviewed and confirmed nursing documentation for past medical history, family history, social history.  Reassessment and Plan:   COVID, flu negative.  She is not pregnant.  CBC with very mild leukocytosis.  CMP is unremarkable.  CT head neck is unremarkable, no signs of aneurysm or other intracranial abnormality.  On reassessment she has no nausea and is tolerating p.o.  No abdominal pain.  Headache is nearly resolved.  She feels comfortable at home.  Suspect she has likely gastritis which led to her headache.  She is on Ozempic and recently increased the dose which could be contributing.  Recommended she call her doctor on Monday given prescription for Zofran.  She was discharged with strict return precautions.   Patient's presentation is most consistent with acute complicated illness / injury requiring  diagnostic workup.           Final Clinical Impression(s) / ED Diagnoses Final diagnoses:  Nausea and vomiting, unspecified vomiting type    Rx / DC Orders ED Discharge Orders          Ordered    ondansetron (ZOFRAN-ODT) 4 MG disintegrating tablet  Every 8 hours PRN        11/29/22 2306              Laurence Spates, MD 11/29/22 2308

## 2022-11-29 NOTE — ED Notes (Signed)
Phlebotomy attempt unsuccessful

## 2022-11-29 NOTE — ED Triage Notes (Signed)
Patient reports nausea and vomiting since yesterday and headache starting today. Patient reports she attempted to take zofran, but threw it up. Patient denies fevers, diarrhea, and abdominal pain.

## 2023-01-08 ENCOUNTER — Ambulatory Visit: Payer: BLUE CROSS/BLUE SHIELD | Admitting: Cardiology

## 2023-01-12 ENCOUNTER — Ambulatory Visit: Payer: 59 | Attending: Cardiology | Admitting: Cardiology

## 2023-01-12 ENCOUNTER — Ambulatory Visit (INDEPENDENT_AMBULATORY_CARE_PROVIDER_SITE_OTHER): Payer: 59

## 2023-01-12 ENCOUNTER — Encounter: Payer: Self-pay | Admitting: Cardiology

## 2023-01-12 VITALS — BP 100/80 | HR 87 | Ht 66.0 in | Wt 158.0 lb

## 2023-01-12 DIAGNOSIS — R002 Palpitations: Secondary | ICD-10-CM

## 2023-01-12 DIAGNOSIS — R42 Dizziness and giddiness: Secondary | ICD-10-CM

## 2023-01-12 NOTE — Progress Notes (Signed)
  Electrophysiology Office Note:   Date:  01/12/2023  ID:  Hannah Mcmahon, DOB 10/08/1990, MRN [id number]  Primary Cardiologist: None Electrophysiologist: None      History of Present Illness:   Hannah Mcmahon is a 32 y.o. female with h/o palpitations, near syncope seen today for  for Electrophysiology evaluation of palpitations at the request of Hannah Mcmahon.    She presents today with a history of palpitations and near syncope.  She has been feeling this way for many months.  Episodes occur on a daily basis.  There are no exacerbating or alleviating factors to the episodes.  She states that she has near syncope when she has the episodes.  They last for a few minutes at a time.  At times they occur when she stands up quickly, but can occur when she is seated or has been standing for a while.  They have been occurring in the shower.  She at times needs help getting out of the shower.  They also happen at work and she has to sit down.  She works as a Scientist, research (medical).  At times, she has to go to the back and take a break.  She feels that putting her head down Hannah Mcmahon help with the episodes.  Review of systems complete and found to be negative unless listed in HPI.   EP Information / Studies Reviewed:    EKG is ordered today. Personal review as below.  EKG Interpretation Date/Time:  Monday January 12 2023 10:02:08 EDT Ventricular Rate:  87 PR Interval:  126 QRS Duration:  108 QT Interval:  378 QTC Calculation: 454 R Axis:   62  Text Interpretation: Normal sinus rhythm Possible Left atrial enlargement Incomplete right bundle branch block When compared with ECG of 29-Nov-2022 20:47, No significant change since last tracing Confirmed by Hannah Mcmahon ([id number]) on 01/12/2023 10:11:40 AM     Risk Assessment/Calculations:              Physical Exam:   VS:  BP 100/80 (BP Location: Left Arm, Patient Position: Sitting, Cuff Size: Normal)   Pulse 87   Ht 5\' 6"  (1.676 m)   Wt 158 lb (71.7 kg)   BMI  25.50 kg/m    Wt Readings from Last 3 Encounters:  01/12/23 158 lb (71.7 kg)  11/29/22 165 lb (74.8 kg)  12/16/16 195 lb 6.4 oz (88.6 kg)     GEN: Well nourished, well developed in no acute distress NECK: No JVD; No carotid bruits CARDIAC: Regular rate and rhythm, no murmurs, rubs, gallops RESPIRATORY:  Clear to auscultation without rales, wheezing or rhonchi  ABDOMEN: Soft, non-tender, non-distended EXTREMITIES:  No edema; No deformity   ASSESSMENT AND PLAN:    1.  Palpitation/near syncope: Unclear as to the cause.  Her EKG is normal.  She is not hypotensive.  This could certainly be a autonomic dysfunction issue versus an arrhythmia.  Hannah Mcmahon have her wear a 2-week monitor.  Hannah Mcmahon see her back once the monitor is resulted.  Otherwise we Hannah Mcmahon continue current therapy.  Follow up with Dr. Elberta Mcmahon  6 weeks   Signed, Hannah Neale Jorja Loa, MD

## 2023-01-12 NOTE — Patient Instructions (Addendum)
Medication Instructions:  Your physician recommends that you continue on your current medications as directed. Please refer to the Current Medication list given to you today.  *If you need a refill on your cardiac medications before your next appointment, please call your pharmacy*   Lab Work: None ordered If you have labs (blood work) drawn today and your tests are completely normal, you will receive your results only by: MyChart Message (if you have MyChart) OR A paper copy in the mail If you have any lab test that is abnormal or we need to change your treatment, we will call you to review the results.   Testing/Procedures: None ordered   Follow-Up: At [facility], you and your health needs are our priority.  As part of our continuing mission to provide you with exceptional heart care, we have created designated Provider Care Teams.  These Care Teams include your primary Cardiologist (physician) and Advanced Practice Providers (APPs -  Physician Assistants and Nurse Practitioners) who all work together to provide you with the care you need, when you need it.   Your next appointment:   6 week(s)  The format for your next appointment:   In Person  Provider:   Loman Brooklyn, MD    Thank you for choosing [facility]!!   Dory Horn, RN [phone]

## 2023-01-12 NOTE — Progress Notes (Unsigned)
14 day [employer] event , serial [id number] from office inventory applied to patient using Hydrocolloid strips.  Patient also given a Bridge with 25M Red Dot Repositionable electrodes.

## 2023-02-05 ENCOUNTER — Encounter: Payer: Self-pay | Admitting: Cardiology

## 2023-02-06 NOTE — Telephone Encounter (Signed)
Left detailed message.  See monitor report.

## 2023-02-24 ENCOUNTER — Encounter: Payer: Self-pay | Admitting: Cardiology

## 2023-02-24 ENCOUNTER — Ambulatory Visit: Payer: 59 | Attending: Cardiology | Admitting: Cardiology

## 2023-02-24 VITALS — BP 104/60 | HR 84 | Ht 66.0 in | Wt 158.2 lb

## 2023-02-24 DIAGNOSIS — G909 Disorder of the autonomic nervous system, unspecified: Secondary | ICD-10-CM

## 2023-02-24 MED ORDER — DILTIAZEM HCL ER COATED BEADS 180 MG PO CP24: 180.0000 mg | ORAL_CAPSULE | Freq: Every day | ORAL | 3 refills | Status: DC

## 2023-02-24 NOTE — Progress Notes (Signed)
  Electrophysiology Office Note:   Date:  02/24/2023  ID:  Hannah Mcmahon, DOB 1990-11-05, MRN [id number]  Primary Cardiologist: None Electrophysiologist: None      History of Present Illness:   Hannah Mcmahon is a 32 y.o. female with h/o Palpitations and near syncope seen today for routine electrophysiology followup.   Since last being seen in our clinic the patient reports doing about the same.  She continues to have palpitations which are quite episodic.  At times they occur when she changes position, but they also occur when she is sitting.  They have occurred where she drives her car, or is sitting and watching television.  There are no obvious exacerbating or alleviating factors.  She does have days where she feels well and does not have issues.  She is also had them occur when she is in the shower and has needed help to get out of the shower.  she denies chest pain, palpitations, dyspnea, PND, orthopnea, nausea, vomiting, dizziness, syncope, edema, weight gain, or early satiety.   Review of systems complete and found to be negative unless listed in HPI.   EP Information / Studies Reviewed:    EKG is not ordered today. EKG from 01/12/23 reviewed which showed sinus rhythm        Risk Assessment/Calculations:              Physical Exam:   VS:  BP 104/60 (BP Location: Left Arm, Patient Position: Sitting, Cuff Size: Normal)   Pulse 84   Ht 5\' 6"  (1.676 m)   Wt 158 lb 3.2 oz (71.8 kg)   SpO2 98%   BMI 25.53 kg/m    Wt Readings from Last 3 Encounters:  02/24/23 158 lb 3.2 oz (71.8 kg)  01/12/23 158 lb (71.7 kg)  11/29/22 165 lb (74.8 kg)     GEN: Well nourished, well developed in no acute distress NECK: No JVD; No carotid bruits CARDIAC: Regular rate and rhythm, no murmurs, rubs, gallops RESPIRATORY:  Clear to auscultation without rales, wheezing or rhonchi  ABDOMEN: Soft, non-tender, non-distended EXTREMITIES:  No edema; No deformity   ASSESSMENT AND PLAN:    1.   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  She wore a cardiac monitor that shows heart rates in the 100-120s associated with symptoms of palpitations, lightheadedness, shortness of breath.  She has also had arm tingling.  Have counseled her on increasing fluid intake and salty diet.  Have also encouraged exercise which may need to be recumbent.  Hannah Mcmahon start her on diltiazem 180 mg daily.  She is planning to move to [place].  Hannah Mcmahon see her back if issues arise with her move and if she is still in town in the next few months.  Follow up with [name]  PRN   Signed, Reniah Cottingham Jorja Loa, MD

## 2023-02-24 NOTE — Patient Instructions (Signed)
Medication Instructions:  Your physician has recommended you make the following change in your medication: START Diltiazem 180 mg daily  *If you need a refill on your cardiac medications before your next appointment, please call your pharmacy*   Lab Work: None ordered   Testing/Procedures: None ordered   Follow-Up: At [facility], you and your health needs are our priority.  As part of our continuing mission to provide you with exceptional heart care, we have created designated Provider Care Teams.  These Care Teams include your primary Cardiologist (physician) and Advanced Practice Providers (APPs -  Physician Assistants and Nurse Practitioners) who all work together to provide you with the care you need, when you need it.  Your next appointment:   3 month(s)  The format for your next appointment:   In Person  Provider:   Francis Dowse, PA-C    Thank you for choosing [facility]!!   Dory Horn, RN [phone]

## 2023-04-17 ENCOUNTER — Encounter: Payer: Self-pay | Admitting: Cardiology

## 2023-04-22 NOTE — Telephone Encounter (Signed)
 Faxed records to [phone], confirmation received.

## 2023-05-15 NOTE — Telephone Encounter (Signed)
 Left detailed message for pt informing her that I am not sure if records were sent.  Aware I did forward this to medical records on 1/31 but cannot see if this was completed on their end. Informed that I would send back to medical records to address. Asked pt to also update via mychart to confirm records still needed as of today.

## 2023-05-21 NOTE — Telephone Encounter (Signed)
Left message informing pt that records were faxed.

## 2023-06-21 ENCOUNTER — Other Ambulatory Visit: Payer: Self-pay | Admitting: Cardiology
